# Patient Record
Sex: Male | Born: 1950 | Race: White | Hispanic: No | Marital: Married | Smoking: Current every day smoker
Health system: Southern US, Community
[De-identification: ages and names within clinical notes are randomized; demographics above are authoritative.]

## PROBLEM LIST (undated history)

## (undated) DIAGNOSIS — B1920 Unspecified viral hepatitis C without hepatic coma: Secondary | ICD-10-CM

## (undated) HISTORY — PX: OTHER SURGICAL HISTORY: SHX169

---

## 2007-09-02 ENCOUNTER — Ambulatory Visit: Payer: Self-pay | Admitting: *Deleted

## 2015-10-10 ENCOUNTER — Emergency Department: Payer: Non-veteran care

## 2015-10-10 ENCOUNTER — Encounter: Payer: Self-pay | Admitting: Emergency Medicine

## 2015-10-10 ENCOUNTER — Inpatient Hospital Stay
Admission: EM | Admit: 2015-10-10 | Discharge: 2015-10-13 | DRG: 545 | Disposition: A | Discharge status: Other Acute Inpatient Hospital | Payer: Non-veteran care | Attending: Internal Medicine | Admitting: Internal Medicine

## 2015-10-10 DIAGNOSIS — F17210 Nicotine dependence, cigarettes, uncomplicated: Secondary | ICD-10-CM | POA: Diagnosis present

## 2015-10-10 DIAGNOSIS — M314 Aortic arch syndrome [Takayasu]: Principal | ICD-10-CM | POA: Diagnosis present

## 2015-10-10 DIAGNOSIS — R079 Chest pain, unspecified: Secondary | ICD-10-CM | POA: Diagnosis present

## 2015-10-10 DIAGNOSIS — R1013 Epigastric pain: Secondary | ICD-10-CM | POA: Diagnosis present

## 2015-10-10 DIAGNOSIS — I719 Aortic aneurysm of unspecified site, without rupture: Secondary | ICD-10-CM | POA: Diagnosis present

## 2015-10-10 DIAGNOSIS — R0902 Hypoxemia: Secondary | ICD-10-CM | POA: Diagnosis present

## 2015-10-10 DIAGNOSIS — I700 Atherosclerosis of aorta: Secondary | ICD-10-CM | POA: Diagnosis not present

## 2015-10-10 DIAGNOSIS — R52 Pain, unspecified: Secondary | ICD-10-CM

## 2015-10-10 DIAGNOSIS — I119 Hypertensive heart disease without heart failure: Secondary | ICD-10-CM | POA: Diagnosis present

## 2015-10-10 DIAGNOSIS — I7100 Dissection of unspecified site of aorta: Secondary | ICD-10-CM | POA: Diagnosis present

## 2015-10-10 DIAGNOSIS — R001 Bradycardia, unspecified: Secondary | ICD-10-CM | POA: Diagnosis present

## 2015-10-10 DIAGNOSIS — Z720 Tobacco use: Secondary | ICD-10-CM | POA: Diagnosis present

## 2015-10-10 DIAGNOSIS — I10 Essential (primary) hypertension: Secondary | ICD-10-CM

## 2015-10-10 DIAGNOSIS — I716 Thoracoabdominal aortic aneurysm, without rupture: Secondary | ICD-10-CM | POA: Diagnosis present

## 2015-10-10 DIAGNOSIS — I452 Bifascicular block: Secondary | ICD-10-CM | POA: Diagnosis present

## 2015-10-10 DIAGNOSIS — I7101 Dissection of thoracic aorta: Secondary | ICD-10-CM | POA: Diagnosis present

## 2015-10-10 DIAGNOSIS — I723 Aneurysm of iliac artery: Secondary | ICD-10-CM | POA: Diagnosis present

## 2015-10-10 DIAGNOSIS — Z7982 Long term (current) use of aspirin: Secondary | ICD-10-CM

## 2015-10-10 DIAGNOSIS — J439 Emphysema, unspecified: Secondary | ICD-10-CM | POA: Diagnosis present

## 2015-10-10 DIAGNOSIS — R071 Chest pain on breathing: Secondary | ICD-10-CM | POA: Diagnosis not present

## 2015-10-10 DIAGNOSIS — Z79899 Other long term (current) drug therapy: Secondary | ICD-10-CM | POA: Diagnosis not present

## 2015-10-10 DIAGNOSIS — Z01810 Encounter for preprocedural cardiovascular examination: Secondary | ICD-10-CM | POA: Diagnosis not present

## 2015-10-10 DIAGNOSIS — F17200 Nicotine dependence, unspecified, uncomplicated: Secondary | ICD-10-CM | POA: Diagnosis present

## 2015-10-10 HISTORY — DX: Unspecified viral hepatitis C without hepatic coma: B19.20

## 2015-10-10 LAB — BASIC METABOLIC PANEL
ANION GAP: 9 (ref 5–15)
BUN: 14 mg/dL (ref 6–20)
CALCIUM: 9.5 mg/dL (ref 8.9–10.3)
CO2: 24 mmol/L (ref 22–32)
CREATININE: 0.67 mg/dL (ref 0.61–1.24)
Chloride: 103 mmol/L (ref 101–111)
Glucose, Bld: 115 mg/dL — ABNORMAL HIGH (ref 65–99)
Potassium: 3.8 mmol/L (ref 3.5–5.1)
SODIUM: 136 mmol/L (ref 135–145)

## 2015-10-10 LAB — HEPATIC FUNCTION PANEL
ALBUMIN: 3.9 g/dL (ref 3.5–5.0)
ALT: 80 U/L — AB (ref 17–63)
AST: 79 U/L — AB (ref 15–41)
Alkaline Phosphatase: 72 U/L (ref 38–126)
Bilirubin, Direct: 0.1 mg/dL (ref 0.1–0.5)
Indirect Bilirubin: 0.5 mg/dL (ref 0.3–0.9)
TOTAL PROTEIN: 7.3 g/dL (ref 6.5–8.1)
Total Bilirubin: 0.6 mg/dL (ref 0.3–1.2)

## 2015-10-10 LAB — URINALYSIS COMPLETE WITH MICROSCOPIC (ARMC ONLY)
BILIRUBIN URINE: NEGATIVE
Bacteria, UA: NONE SEEN
GLUCOSE, UA: NEGATIVE mg/dL
Hgb urine dipstick: NEGATIVE
KETONES UR: NEGATIVE mg/dL
Leukocytes, UA: NEGATIVE
NITRITE: NEGATIVE
PROTEIN: 100 mg/dL — AB
SPECIFIC GRAVITY, URINE: 1.033 — AB (ref 1.005–1.030)
Squamous Epithelial / LPF: NONE SEEN
pH: 7 (ref 5.0–8.0)

## 2015-10-10 LAB — CBC
HCT: 44.2 % (ref 40.0–52.0)
HEMOGLOBIN: 15.7 g/dL (ref 13.0–18.0)
MCH: 30.8 pg (ref 26.0–34.0)
MCHC: 35.6 g/dL (ref 32.0–36.0)
MCV: 86.5 fL (ref 80.0–100.0)
PLATELETS: 146 10*3/uL — AB (ref 150–440)
RBC: 5.11 MIL/uL (ref 4.40–5.90)
RDW: 13 % (ref 11.5–14.5)
WBC: 10.4 10*3/uL (ref 3.8–10.6)

## 2015-10-10 LAB — TROPONIN I

## 2015-10-10 LAB — LIPASE, BLOOD: LIPASE: 33 U/L (ref 11–51)

## 2015-10-10 MED ORDER — MORPHINE SULFATE (PF) 4 MG/ML IV SOLN
4.0000 mg | Freq: Once | INTRAVENOUS | Status: AC
  Administered 2015-10-10: 4 mg via INTRAVENOUS

## 2015-10-10 MED ORDER — MORPHINE SULFATE (PF) 4 MG/ML IV SOLN
4.0000 mg | Freq: Once | INTRAVENOUS | Status: AC
  Administered 2015-10-10: 4 mg via INTRAVENOUS
  Filled 2015-10-10: qty 1

## 2015-10-10 MED ORDER — HYDROMORPHONE HCL 1 MG/ML IJ SOLN
1.0000 mg | INTRAMUSCULAR | Status: DC | PRN
  Administered 2015-10-11 (×3): 1 mg via INTRAVENOUS
  Filled 2015-10-10 (×3): qty 1

## 2015-10-10 MED ORDER — MORPHINE SULFATE (PF) 4 MG/ML IV SOLN
INTRAVENOUS | Status: AC
  Administered 2015-10-10: 4 mg via INTRAVENOUS
  Filled 2015-10-10: qty 1

## 2015-10-10 MED ORDER — NITROPRUSSIDE SODIUM 25 MG/ML IV SOLN
0.0000 ug/kg/min | Freq: Once | INTRAVENOUS | Status: AC
  Administered 2015-10-10: 0.3 ug/kg/min via INTRAVENOUS
  Filled 2015-10-10: qty 2

## 2015-10-10 MED ORDER — SODIUM CHLORIDE 0.9 % IV BOLUS (SEPSIS)
500.0000 mL | Freq: Once | INTRAVENOUS | Status: AC
  Administered 2015-10-10: 500 mL via INTRAVENOUS

## 2015-10-10 MED ORDER — ONDANSETRON HCL 4 MG/2ML IJ SOLN
4.0000 mg | Freq: Once | INTRAMUSCULAR | Status: AC
  Administered 2015-10-10: 4 mg via INTRAVENOUS
  Filled 2015-10-10: qty 2

## 2015-10-10 MED ORDER — IOPAMIDOL (ISOVUE-370) INJECTION 76%
100.0000 mL | Freq: Once | INTRAVENOUS | Status: AC | PRN
  Administered 2015-10-10: 100 mL via INTRAVENOUS

## 2015-10-10 NOTE — ED Provider Notes (Signed)
[facility]. I asked if the paperwork can be filled out to confirm this. The person on talking to is a resident and cannot do it but she says she will talk to the AOD.   Cory NatalPaul F Malinda, MD 10/10/15 2158

## 2015-10-10 NOTE — ED Provider Notes (Addendum)
[facility] Emergency Department Provider Note   ____________________________________________   First MD Initiated Contact with Patient 10/10/15 1644     (approximate)  I have reviewed the triage vital signs and the nursing notes.   HISTORY  Chief Complaint Chest Pain and Abdominal Pain    HPI Cory Wilson is a 65 y.o. male with no chronic medical problems, though admittedly he has not seen a primary care doctor nearly 10 years, who presents for evaluation of intermittent epigastric pain radiating into the chest ongoing today, gradual onset, constant, no modifying factors, severe on EMS arrival but improved after he received nitroglycerin and aspirin, currently mild. No shortness of breath, no sudden sweating, no vomiting, diarrhea, fevers or chills. He has never had this pain before.   Past Medical History:  Diagnosis Date  . Hepatitis C     Patient Active Problem List   Diagnosis Date Noted  . Tobacco use   . Penetrating atherosclerotic ulcer of aorta (HCC)   . Dissection of aorta, unspecified site   . Tobacco use disorder   . Essential hypertension   . Pain in the chest   . Intramural aortic hematoma (HCC) 10/10/2015    Past Surgical History:  Procedure Laterality Date  . 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 Pt states he caught something on deployment to [place]    Prior to Admission medications   Medication Sig Start Date End Date Taking? Authorizing Provider  aspirin EC 81 MG tablet Take 81 mg by mouth daily.   Yes Historical Provider, MD  Fish Oil-Cholecalciferol (FISH OIL + D3 PO) Take 1 capsule by mouth daily.   Yes Historical Provider, MD  Multiple Vitamins-Minerals (MULTIVITAMIN WITH MINERALS) tablet Take 1 tablet by mouth daily.   Yes Historical Provider, MD  albuterol (PROVENTIL) (2.5 MG/3ML) 0.083% nebulizer solution Take 3 mLs (2.5 mg total) by nebulization every 4 (four) hours as  needed for wheezing or shortness of breath. 10/13/15   Wyatt Haste, MD  antiseptic oral rinse (CPC / CETYLPYRIDINIUM CHLORIDE 0.05%) 0.05 % LIQD solution 7 mLs by Mouth Rinse route 2 (two) times daily. 10/13/15   Wyatt Haste, MD  famotidine (PEPCID) 20-0.9 MG/50ML-% Inject 50 mLs (20 mg total) into the vein every 12 (twelve) hours. 10/13/15   Wyatt Haste, MD  hydrALAZINE (APRESOLINE) 25 MG tablet Take 1 tablet (25 mg total) by mouth every 8 (eight) hours. 10/13/15   Wyatt Haste, MD  HYDROmorphone (DILAUDID) 1 MG/ML injection Inject 1 mL (1 mg total) into the vein every 2 (two) hours as needed for severe pain. 10/13/15   Wyatt Haste, MD  ipratropium-albuterol (DUONEB) 0.5-2.5 (3) MG/3ML SOLN Take 3 mLs by nebulization every 4 (four) hours. 10/13/15   Wyatt Haste, MD  labetalol (NORMODYNE,TRANDATE) 5 MG/ML injection Inject 4 mLs (20 mg total) into the vein every 4 (four) hours as needed (for SBP >150). 10/13/15   Wyatt Haste, MD  losartan (COZAAR) 50 MG tablet Take 1 tablet (50 mg total) by mouth daily. 10/13/15   Wyatt Haste, MD  mometasone-formoterol [facility]) 100-5 MCG/ACT AERO Inhale 2 puffs into the lungs 2 (two) times daily. 10/13/15   Wyatt Haste, MD  niCARdipine in saline (CARDENE-IV) 20-0.86 MG/200ML-% SOLN Inject 3-15 mg/hr into the vein continuous. 10/13/15   Wyatt Haste, MD  nicotine (NICODERM CQ - DOSED IN MG/24 HOURS) 21 mg/24hr patch Place 1 patch (21  mg total) onto the skin daily. 10/13/15   Wyatt Haste, MD  nitroPRUSSide 50 mg in dextrose 5 % 250 mL Inject 0-0.2634 mg/min into the vein continuous. 10/13/15   Wyatt Haste, MD  ondansetron [facility]) 4 MG/2ML SOLN injection Inject 2 mLs (4 mg total) into the vein every 6 (six) hours as needed for nausea. 10/13/15   Wyatt Haste, MD  pantoprazole (PROTONIX) 40 MG tablet Take 1 tablet (40 mg total) by mouth daily at 12 noon. 10/13/15   Wyatt Haste, MD  senna (SENOKOT) 8.6 MG TABS tablet Take 1 tablet (8.6 mg total) by mouth daily. 10/13/15   Wyatt Haste, MD  sodium chloride 0.9 % infusion Inject 250 mLs into the vein as needed (if IV carrier fluid needed.). 10/13/15   Wyatt Haste, MD  sodium chloride 0.9 % infusion Inject 100 mLs into the vein continuous. 10/13/15   Wyatt Haste, MD  tiotropium (SPIRIVA) 18 MCG inhalation capsule Place 1 capsule (18 mcg total) into inhaler and inhale daily. 10/13/15   Wyatt Haste, MD  zolpidem (AMBIEN) 5 MG tablet Take 1 tablet (5 mg total) by mouth at bedtime as needed for sleep. 10/13/15   Wyatt Haste, MD    Allergies Review of patient's allergies indicates no known allergies.  History reviewed. No pertinent family history.  Social History Social History  Substance Use Topics  . Smoking status: Current Every Day Smoker    Packs/day: 1.00    Types: Cigarettes  . Smokeless tobacco: Never Used  . Alcohol use No    Review of Systems Constitutional: No fever/chills Eyes: No visual changes. ENT: No sore throat. Cardiovascular: +chest pain. Respiratory: Denies shortness of breath. Gastrointestinal: + abdominal pain.  No nausea, no vomiting.  No diarrhea.  No constipation. Genitourinary: Negative for dysuria. Musculoskeletal: Negative for back pain. Skin: Negative for rash. Neurological: Negative for headaches, focal weakness or numbness.  10-point ROS otherwise negative.  ____________________________________________   PHYSICAL EXAM:  Vitals:   10/13/15 1220 10/13/15 1225 10/13/15 1230 10/13/15 1235  BP: (!) 104/57 109/88 129/71 120/69  Pulse: 62 62 (!) 59 (!) 59  Resp: Temp:      TempSrc:      SpO2: 92% 91% 93% 93%  Weight:      Height:          Constitutional: Alert and oriented. Well appearing and in no acute distress. Eyes: Conjunctivae are normal. PERRL. EOMI. Head: Atraumatic. Nose: No congestion/rhinnorhea. Mouth/Throat: Mucous membranes are moist.  Oropharynx non-erythematous. Neck: No stridor. Supple without meningismus. Cardiovascular: Normal rate,  regular rhythm. Grossly normal heart sounds.  Good peripheral circulation. Respiratory: Normal respiratory effort.  No retractions. Lungs CTAB. Gastrointestinal: Soft with mild tenderness to palpation throughout the epigastrium, no rebound or guarding.Marland Kitchen No CVA tenderness. Genitourinary: Deferred Musculoskeletal: No lower extremity tenderness nor edema.  No joint effusions. Neurologic:  Normal speech and language. No gross focal neurologic deficits are appreciated. No gait instability. Skin:  Skin is warm, dry and intact. No rash noted. Psychiatric: Mood and affect are normal. Speech and behavior are normal.  ____________________________________________   LABS (all labs ordered are listed, but only abnormal results are displayed)  Labs Reviewed  BASIC METABOLIC PANEL - Abnormal; Notable for the following:       Result Value   Glucose, Bld 115 (*)    All other components within normal limits  CBC - Abnormal; Notable for the following:  Platelets 146 (*)    All other components within normal limits  HEPATIC FUNCTION PANEL - Abnormal; Notable for the following:    AST 79 (*)    ALT 80 (*)    All other components within normal limits  URINALYSIS COMPLETEWITH MICROSCOPIC ([facility] ONLY) - Abnormal; Notable for the following:    Color, Urine YELLOW (*)    APPearance HAZY (*)    Specific Gravity, Urine 1.033 (*)    Protein, ur 100 (*)    All other components within normal limits  CBC - Abnormal; Notable for the following:    WBC 12.3 (*)    Platelets 139 (*)    All other components within normal limits  BASIC METABOLIC PANEL - Abnormal; Notable for the following:    Glucose, Bld 169 (*)    All other components within normal limits  LIPID PANEL - Abnormal; Notable for the following:    Triglycerides 386 (*)    HDL 28 (*)    VLDL 77 (*)    All other components within normal limits  GLUCOSE, CAPILLARY - Abnormal; Notable for the following:    Glucose-Capillary 125 (*)    All other  components within normal limits  BASIC METABOLIC PANEL - Abnormal; Notable for the following:    Glucose, Bld 142 (*)    Creatinine, Ser 0.59 (*)    Calcium 8.4 (*)    All other components within normal limits  HEPATIC FUNCTION PANEL - Abnormal; Notable for the following:    AST 57 (*)    ALT 66 (*)    Total Bilirubin 1.6 (*)    Bilirubin, Direct 0.6 (*)    Indirect Bilirubin 1.0 (*)    All other components within normal limits  BASIC METABOLIC PANEL - Abnormal; Notable for the following:    Glucose, Bld 131 (*)    Creatinine, Ser 0.57 (*)    Calcium 8.7 (*)    All other components within normal limits  CBC - Abnormal; Notable for the following:    WBC 16.6 (*)    Platelets 139 (*)    All other components within normal limits  BLOOD GAS, ARTERIAL - Abnormal; Notable for the following:    pO2, Arterial 65 (*)    Acid-Base Excess 3.5 (*)    Allens test (pass/fail) POSITIVE (*)    All other components within normal limits  CBC - Abnormal; Notable for the following:    WBC 18.7 (*)    RBC 4.23 (*)    HCT 37.4 (*)    Platelets 139 (*)    All other components within normal limits  BASIC METABOLIC PANEL - Abnormal; Notable for the following:    Sodium 132 (*)    Glucose, Bld 138 (*)    BUN 27 (*)    Calcium 7.6 (*)    All other components within normal limits  MRSA PCR SCREENING  TROPONIN I  LIPASE, BLOOD  PHOSPHORUS  MAGNESIUM  MAGNESIUM  PHOSPHORUS  TROPONIN I  LACTIC ACID, PLASMA  LIPASE, BLOOD  MAGNESIUM  TROPONIN I  LACTIC ACID, PLASMA   ____________________________________________  EKG  ED ECG REPORT I, Gayla Doss, the attending physician, personally viewed and interpreted this ECG.   Date: 10/10/2015  EKG Time: 16:49  Rate: 53  Rhythm: normal sinus rhythm  Axis: normal  Intervals:right bundle branch block and LAFB  ST&T Change: No acute ST elevation or acute ST depression. T-wave abnormalities in the inferior leads, Q waves in aVL, aVR,  V1,  V2.  ____________________________________________  RADIOLOGY  CXR IMPRESSION: No radiographic evidence of acute cardiopulmonary disease.  RUQ ultrasound IMPRESSION: Small gallstones, gallbladder polyps in gallbladder sludge. No sonographic Eulah Pont sign is noted.  Changes suggestive of aortic aneurysm with possible dissection. A CT is already scheduled for further evaluation of this abnormality.  CTA chest, abdomen and pelvis IMPRESSION: 1. Positive for acute aortic syndrome in the chest with aortic hematoma originating just distal to the great vessels. 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 No periaortic soft tissue stranding. 2. Infrarenal abdominal aorta measuring 5.0 x 4.7 cm. Eccentric mural thrombus with thin band of thrombus versus short-segment dissection in the infrarenal portion. No evidence of aortic rupture. Recommend followup by abdomen and pelvis CTA in 3-6 months, and vascular surgery referral/consultation if not already obtained. This recommendation follows ACR consensus guidelines: White Paper of the ACR Incidental Findings Committee II on Vascular Findings. J Am Coll Radiol 2013; 10:789-794. 3. 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 4. Incidental chest findings of emphysema and coronary artery calcifications. 5. Incidental abdomen/pelvis findings of sigmoid colonic diverticulosis without evidence of diverticulitis. Critical Value/emergent preliminary results were discussed by telephone at the time of interpretation on 10/10/2015 at 7:20 pm with Dr. Toney Rakes , who verbally acknowledged these results. ____________________________________________   PROCEDURES  Procedure(s) performed: None  Procedures  Critical Care performed:   CRITICAL CARE Performed by: Toney Rakes A   Total critical care time: 35 minutes  Critical care time was exclusive  of separately billable procedures and treating other patients.  Critical care was necessary to treat or prevent imminent or life-threatening deterioration.  Critical care was time spent personally by me on the following activities: development of treatment plan with patient and/or surrogate as well as nursing, discussions with consultants, evaluation of patient's response to treatment, examination of patient, obtaining history from patient or surrogate, ordering and performing treatments and interventions, ordering and review of laboratory studies, ordering and review of radiographic studies, pulse oximetry and re-evaluation of patient's condition.  ____________________________________________   INITIAL IMPRESSION / ASSESSMENT AND PLAN / ED COURSE  Pertinent labs & imaging results that were available during my care of the patient were reviewed by me and considered in my medical decision making (see chart for details).  Cory Wilson is a 65 y.o. male with no chronic medical problems, though admittedly he has not seen a primary care doctor in over 8 years, who presents for evaluation of intermittent epigastric pain radiating into the chest ongoing today. On exam, he is generally well-appearing and in no acute distress. Vital signs stable, he is afebrile. He has a benign physical examination. EKG not consistent with acute ischemia however he does have concerning T-wave inversions in the inferior leads that almost look like Wellen's waves. He also has Q waves in several leads. This clinical picture is concerning for possibly atypical ACS though gallbladder pathology, GERD, gastritis is also on the differential. We'll treat him symptomatically, obtain screen cardiac labs, right upper quadrant ultrasound, chest x-ray and reassess for disposition.  ----------------------------------------- 8:00 PM on 10/10/2015 ----------------------------------------- Patient with improvement of his pain at this  time. Ultrasound showed gallstones, no acute cholecystitis with there was question of aortic aneurysm with dissection for which CTA of the chest and pelvis was obtained and shows penetrating thoracic aortic ulcer with aortic hematoma. I discussed the case with Dr. Wyn Quaker of vascular surgery who advises  admission to the intensivist service and repair of the ulcer can occur nonemergently over the next few days. Patient wants  To [facility] where he has insurance coverage. I have faxed the paperwork to the [facility] in hopes of them accepting him as a transfer but we are awaiting the response. I have started and will actively titrate nitroprusside drip for management of his blood pressure, he is already bradycardic with a heart rate of 50 bpm so we will not give beta blocker. Care transferred to Dr. Darnelle CatalanMalinda at this time.   Clinical Course     ____________________________________________   FINAL CLINICAL IMPRESSION(S) / ED DIAGNOSES  Final diagnoses:  Pain  Epigastric pain  Chest pain, unspecified chest pain type  Penetrating atherosclerotic ulcer of aorta (HCC)  Intramural aortic hematoma (HCC)  Essential hypertension      NEW MEDICATIONS STARTED DURING THIS VISIT:  Discharge Medication List as of 10/13/2015  1:40 PM    START taking these medications   Details  albuterol (PROVENTIL) (2.5 MG/3ML) 0.083% nebulizer solution Take 3 mLs (2.5 mg total) by nebulization every 4 (four) hours as needed for wheezing or shortness of breath., Starting Wed 10/13/2015, No Print    antiseptic oral rinse (CPC / CETYLPYRIDINIUM CHLORIDE 0.05%) 0.05 % LIQD solution 7 mLs by Mouth Rinse route 2 (two) times daily., Starting Wed 10/13/2015, No Print    famotidine (PEPCID) 20-0.9 MG/50ML-% Inject 50 mLs (20 mg total) into the vein every 12 (twelve) hours., Starting Wed 10/13/2015, No Print    hydrALAZINE (APRESOLINE) 25 MG tablet Take 1 tablet (25 mg total) by mouth every 8 (eight) hours., Starting Wed 10/13/2015, No Print     HYDROmorphone (DILAUDID) 1 MG/ML injection Inject 1 mL (1 mg total) into the vein every 2 (two) hours as needed for severe pain., Starting Wed 10/13/2015, No Print    ipratropium-albuterol (DUONEB) 0.5-2.5 (3) MG/3ML SOLN Take 3 mLs by nebulization every 4 (four) hours., Starting Wed 10/13/2015, No Print    labetalol (NORMODYNE,TRANDATE) 5 MG/ML injection Inject 4 mLs (20 mg total) into the vein every 4 (four) hours as needed (for SBP >150)., Starting Wed 10/13/2015, No Print    losartan (COZAAR) 50 MG tablet Take 1 tablet (50 mg total) by mouth daily., Starting Wed 10/13/2015, No Print    mometasone-formoterol (DULERA) 100-5 MCG/ACT AERO Inhale 2 puffs into the lungs 2 (two) times daily., Starting Wed 10/13/2015, No Print    niCARdipine in saline (CARDENE-IV) 20-0.86 MG/200ML-% SOLN Inject 3-15 mg/hr into the vein continuous., Starting Wed 10/13/2015, No Print    nicotine (NICODERM CQ - DOSED IN MG/24 HOURS) 21 mg/24hr patch Place 1 patch (21 mg total) onto the skin daily., Starting Wed 10/13/2015, No Print    nitroPRUSSide 50 mg in dextrose 5 % 250 mL Inject 0-0.2634 mg/min into the vein continuous., Starting Wed 10/13/2015, No Print    ondansetron (ZOFRAN) 4 MG/2ML SOLN injection Inject 2 mLs (4 mg total) into the vein every 6 (six) hours as needed for nausea., Starting Wed 10/13/2015, No Print    pantoprazole (PROTONIX) 40 MG tablet Take 1 tablet (40 mg total) by mouth daily at 12 noon., Starting Wed 10/13/2015, No Print    pneumococcal 23 valent vaccine (PNU-IMMUNE) 25 MCG/0.5ML injection Inject 0.5 mLs into the muscle tomorrow at 10 am., Starting Wed 10/13/2015, No Print    senna (SENOKOT) 8.6 MG TABS tablet Take 1 tablet (8.6 mg total) by mouth daily., Starting Wed 10/13/2015, No Print    !!  sodium chloride 0.9 % infusion Inject 250 mLs into the vein as needed (if IV carrier fluid needed.)., Starting Wed 10/13/2015, No Print    !! sodium chloride 0.9 % infusion Inject 100 mLs into the vein continuous.,  Starting Wed 10/13/2015, No Print    tiotropium (SPIRIVA) 18 MCG inhalation capsule Place 1 capsule (18 mcg total) into inhaler and inhale daily., Starting Wed 10/13/2015, No Print    zolpidem (AMBIEN) 5 MG tablet Take 1 tablet (5 mg total) by mouth at bedtime as needed for sleep., Starting Wed 10/13/2015, No Print     !! - Potential duplicate medications found. Please discuss with provider.       Note:  This document was prepared using Dragon voice recognition software and may include unintentional dictation errors.    Gayla Doss, MD 10/10/15 2010    Gayla Doss, MD 10/14/15 [phone]

## 2015-10-10 NOTE — H&P (Addendum)
[employer] [employer] - [place] @ [facility] Admission History and Physical Tonye Royalty, D.O.  ---------------------------------------------------------------------------------------------------------------------   PATIENT NAME: Cory Wilson MR#: [id number] DATE OF BIRTH: 24-Jan-1951 DATE OF ADMISSION: 10/10/2015 PRIMARY CARE PHYSICIAN: No primary care provider on file.  REQUESTING/REFERRING PHYSICIAN: ED Dr. Darnelle Catalan  CHIEF COMPLAINT: Chief Complaint  Patient presents with  . Chest Pain  . Abdominal Pain    HISTORY OF PRESENT ILLNESS: Cory Wilson is a 65 y.o. male with history of hepatitis C was in a usual state of health until today when he began experiencing 10/10 "pulsating" chest pain in the lower mid-sternum association with nausea, dry heaves, shortness of breath and diaphoresis.  Pain was relieved after EMS placed nitropaste.  He states that he has had "heartburn" in the same region intermittently over the past few months which seemed to have been relieved by Rolaids.  He has not had a physical exam in the past 7 years, and has never had a cardiac workup.    Otherwise there has been no change in status. There has been no recent change in medication or diet.  There has been no recent illness, travel or sick contacts.    Patient denies fevers/chills, weakness, dizziness, C/D, dysuria/frequency, changes in mental status.   EMS/ED COURSE:  Patient rec'd nitropaste in the ambulance was started on a nitroprusside drip in the ED.    PAST MEDICAL HISTORY: Past Medical History:  Diagnosis Date  . Hepatitis C       PAST SURGICAL HISTORY: Past Surgical History:  Procedure Laterality Date  . 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 Pt states he caught something on deployment to [place]      SOCIAL HISTORY: Social History  Substance Use Topics  . Smoking status: Current Every Day Smoker    Packs/day: 1.00    Types: Cigarettes  .  Smokeless tobacco: Never Used  . Alcohol use No      FAMILY HISTORY: History reviewed. No pertinent family history.   MEDICATIONS AT HOME: Prior to Admission medications   Medication Sig Start Date End Date Taking? Authorizing Provider  aspirin EC 81 MG tablet Take 81 mg by mouth daily.   Yes Historical Provider, MD  Fish Oil-Cholecalciferol (FISH OIL + D3 PO) Take 1 capsule by mouth daily.   Yes Historical Provider, MD  Multiple Vitamins-Minerals (MULTIVITAMIN WITH MINERALS) tablet Take 1 tablet by mouth daily.   Yes Historical Provider, MD      DRUG ALLERGIES: No Known Allergies   REVIEW OF SYSTEMS: CONSTITUTIONAL: No fever/chills. No fatigue, weakness. No weight gain, no weight loss. EYES: No blurry or double vision. ENT: No tinnitus. No postnasal drip. No redness or soreness of the oropharynx. RESPIRATORY: No cough, no wheeze, no hemoptysis. No dyspnea. CARDIOVASCULAR:  (+)chest pain. No orthopnea. No palpitations. No syncope. GASTROINTESTINAL: (+) nausea, no vomiting or diarrhea. (+) abdominal pain. No melena or hematochezia. GENITOURINARY: No dysuria or hematuria. ENDOCRINE: No polyuria or nocturia. No heat or cold intolerance. HEMATOLOGY: No anemia. No bruising. No bleeding. INTEGUMENTARY: No rashes. No lesions. MUSCULOSKELETAL: No arthritis. No swelling. No gout. NEUROLOGIC: No numbness, tingling, weakness or ataxia. No seizure-type activity. PSYCHIATRIC: No anxiety. No depression. No insomnia.  PHYSICAL EXAMINATION: VITAL SIGNS: Blood pressure (!) 148/85, pulse 66, temperature 98.1 F (36.7 C), temperature source Oral, resp. rate (!) 24, height 6\' 1"  (1.854 m), weight 97.5 kg (215 lb), SpO2 95 %.  GENERAL: 65 y.o.-year-old white male patient, well-developed, well-nourished lying  in the bed in no acute distress. EYES: Pupils equal, round, reactive to light and accommodation. No scleral icterus. Extraocular muscles intact. HEENT: Head atraumatic, normocephalic.  Oropharynx and nasopharynx clear. Mucus membranes moist. NECK: Supple, full range of motion. No JVD, no bruit heard. No thyroid enlargement, no tenderness, no lymphadenopathy. CHEST: Normal breath sounds bilaterally, no wheezing, rales, rhonchi or crepitation. No use of accessory muscles of respiration.  No reproducible chest wall tenderness.  CARDIOVASCULAR: S1, S2 normal. No murmurs, rubs, or gallops. Cap refill <2 seconds. ABDOMEN: Soft, nontender, nondistended. No palpable mass or bruit heard. No rebound, guarding, rigidity. Normoactive bowel sounds present in all four quadrants. No organomegaly or mass. EXTREMITIES: Full range of motion. No pedal edema, cyanosis, or clubbing. NEUROLOGIC: Cranial nerves II through XII are grossly intact with no focal sensorimotor deficit. Muscle strength 5/5 in all extremities. Sensation intact. Gait not checked. PSYCHIATRIC: The patient is alert and oriented x 3. Normal affect, mood, thought content. SKIN: Warm, dry, and intact without obvious rash, lesion, or ulcer.  LABORATORY PANEL:  CBC  Recent Labs Lab 10/10/15 1701  WBC 10.4  HGB 15.7  HCT 44.2  PLT 146*   ----------------------------------------------------------------------------------------------------------------- Chemistries  Recent Labs Lab 10/10/15 1701  NA 136  K 3.8  CL 103  CO2 24  GLUCOSE 115*  BUN 14  CREATININE 0.67  CALCIUM 9.5  AST 79*  ALT 80*  ALKPHOS 72  BILITOT 0.6   ------------------------------------------------------------------------------------------------------------------ Cardiac Enzymes  Recent Labs Lab 10/10/15 1701  TROPONINI <0.03   ------------------------------------------------------------------------------------------------------------------  RADIOLOGY: Dg Chest Portable 1 View  Result Date: 10/10/2015 CLINICAL DATA:  65 year old male with history of chest pain since 1 p.m. today, with radiation to the right side. Emesis. No associated  shortness of breath. Hypertension. EXAM: PORTABLE CHEST 1 VIEW COMPARISON:  Chest x-ray 09/02/2007. FINDINGS: Multiple tiny calcified granulomas are noted, most evident in the left lower lobe. Calcified left hilar lymph nodes. Lung volumes are normal. No consolidative airspace disease. No pleural effusions. No pneumothorax. No pulmonary nodule or mass noted. Pulmonary vasculature and the cardiomediastinal silhouette are within normal limits. IMPRESSION: No radiographic evidence of acute cardiopulmonary disease. Electronically Signed   By: Trudie Reedaniel  Entrikin M.D.   On: 10/10/2015 17:43   Ct Angio Chest/abd/pel For Dissection W And/or Wo Contrast  Result Date: 10/10/2015 CLINICAL DATA:  Acute onset of epigastric pain today. Clinical concern for aortic dissection. EXAM: CT ANGIOGRAPHY CHEST, ABDOMEN AND PELVIS TECHNIQUE: Multidetector CT imaging through the chest, abdomen and pelvis was performed using the standard protocol during bolus administration of intravenous contrast. Multiplanar reconstructed images and MIPs were obtained and reviewed to evaluate the vascular anatomy. CONTRAST:  100 cc Isovue 370 IV COMPARISON:  No prior comparison exams. FINDINGS: CTA CHEST FINDINGS Vascular: Examination is positive for acute aortic syndrome with intramural hematoma arising just distal a takeoff of the great vessels extending to the level of the diaphragmatic hiatus. The intramural hematoma is hyperdense on noncontrast imaging. Small focus of extraluminal contrast is seen within the is intramural hematoma in the distal thoracic aorta image 75 series 2 vein may be a penetrating ulcer. There is subsequent aneurysmal dilatation of the descending thoracic aorta measuring up to 4 cm. The aorta at the level of the diaphragmatic hiatus measures 3.6 x 3.9 cm. No periaortic soft tissue stranding. There is a conventional branching pattern from the aortic arch. There is atherosclerotic ossified noncalcified plaque at the origins of the  great vessels, without hematoma involvement. There is diffuse calcified and  noncalcified atheromatous plaque. No central pulmonary embolus. There are coronary artery calcifications. Mediastinum: No enlarged mediastinal or hilar lymph nodes. No pericardial fluid. Lungs/pleura: Biapical pleural-parenchymal scarring. Mild emphysema. No confluent airspace disease. No evidence pulmonary edema. Trachea and mainstem bronchi are patent. There is no pleural effusion or pleural fluid. Musculoskeletal: There are no acute or suspicious osseous abnormalities. Multilevel degenerative change throughout the spine with multiple Schmorl's nodes. Review of the MIP images confirms the above findings. CTA ABDOMEN AND PELVIS FINDINGS Vascular: Fusiform infrarenal abdominal aortic aneurysm, proximal aspect less than 1 cm from the takeoff of the left renal artery. Maximal dimension 5.0 x 4.7 cm. Distal extent just proximal to the iliac bifurcation. There is peripherally eccentric thrombus about the right lateral aspect with a thin band of thrombus extending into the opacified lumen versus limited short-segment dissection. There is no periaortic soft tissue stranding. 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 Normal caliber left common iliac artery. The celiac, superior mesenteric, and inferior mesenteric arteries are patent. There is an accessory artery arising from the most abdominal aorta supplying the lesser curvature of the stomach. Single bilateral renal arteries are patent. There is diffuse calcified and noncalcified atheromatous plaque. Hepatobiliary: The liver is prominent size measuring 21 cm cranial caudal. No focal abnormality detected on arterial phase imaging. Gallbladder physiologically distended. No calcified stone. Pancreas:  No ductal dilatation or inflammation. Spleen:  Normal in size.  Normal arterial phase imaging appearance. Adrenals:  No nodule. Kidneys: Symmetric enhancement. No  hydronephrosis. Cyst in the upper left kidney. Stomach/bowel: Distal esophageal wall thickening with small hiatal hernia. Stomach physiologically distended. Suboptimal bowel assessment given lack of enteric contrast. No bowel dilatation. Moderate stool burden throughout the colon. Sigmoid colonic diverticulosis without diverticulitis. Appendix is normal. Lymphatics: No evidence of retroperitoneal, mesenteric or pelvic adenopathy. Bladder: Distended. Reproductive: Central prostatic calcifications. No acute abnormality. Other:  No free air free fluid.  No intra-abdominal abscess. Musculoskeletal: There are no acute or suspicious osseous abnormalities. Degenerative change throughout the lumbar spine, with endplate changes at L5-S1. Review of the MIP images confirms the above findings. IMPRESSION: 1. Positive for acute aortic syndrome in the chest with aortic hematoma originating just distal to the great vessels. 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 No periaortic soft tissue stranding. 2. Infrarenal abdominal aorta measuring 5.0 x 4.7 cm. Eccentric mural thrombus with thin band of thrombus versus short-segment dissection in the infrarenal portion. No evidence of aortic rupture. Recommend followup by abdomen and pelvis CTA in 3-6 months, and vascular surgery referral/consultation if not already obtained. This recommendation follows ACR consensus guidelines: White Paper of the ACR Incidental Findings Committee II on Vascular Findings. J Am Coll Radiol 2013; 10:789-794. 3. 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 4. Incidental chest findings of emphysema and coronary artery calcifications. 5. Incidental abdomen/pelvis findings of sigmoid colonic diverticulosis without evidence of diverticulitis. Critical Value/emergent preliminary results were discussed by telephone at the time of interpretation on 10/10/2015 at 7:20 pm  with Dr. Toney Rakes , who verbally acknowledged these results. Electronically Signed   By: Rubye Oaks M.D.   On: 10/10/2015 19:46   US Abdomen Limited Ruq  Result Date: 10/10/2015 CLINICAL DATA:  Right upper quadrant pain for several hours, initial encounter EXAM: US ABDOMEN LIMITED - RIGHT UPPER QUADRANT COMPARISON:  None. FINDINGS: Gallbladder: Gallbladder is well distended with evidence of gallbladder sludge. Some nondependent echogenicities are  noted along the gallbladder wall consistent with gallbladder polyps. Tiny gallstones are noted as well. No significant pericholecystic fluid is noted. No wall thickening is seen. Common bile duct: Diameter: 3.3 mm. Liver: No focal lesion identified. Within normal limits in parenchymal echogenicity. Incidental note is made of dilatation of the mid infrarenal aorta. The maximum dimension is 5.3 cm. Echogenic line is noted within the aorta with color flow on both sides suggestive of dissection. IMPRESSION: Small gallstones, gallbladder polyps in gallbladder sludge. No sonographic Eulah Pont sign is noted. Changes suggestive of aortic aneurysm with possible dissection. A CT is already scheduled for further evaluation of this abnormality. Electronically Signed   By: Alcide Clever M.D.   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 Q waves in aVL, AVR, V1 and V2.   IMPRESSION AND PLAN:  This is a 65 y.o. male  with a history of hepatitis C now being admitted with: 1. 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 Wyn Quaker, vascular surgery who agreed with ICU admission, nitroprusside drip for BP control, with a goal systolic 100-120, (not using Beta blocker due to low heart rate), preop cardiac evaluation and probably OR for repair on Wednesday pending clearance.  Will use Morphine for pain control, Zofran, Pepcid for GI prophylaxis.  DVT Px will be with SCDs only given hematoma  and high risk of bleeding.    Fluids: IVNS Diet/Nutrition: Clear liquids heart healthy for now DVT Px: SCDs only  All the records are reviewed and case discussed with ED provider. Management plans discussed with the patient and/or family who express understanding and agree with plan of care.  CODE STATUS: Full TOTAL TIME TAKING CARE OF THIS PATIENT: 60 minutes.   Jery Hollern D.O. on 10/10/2015 at 11:37 PM Between 7am to 6pm - Pager - [phone] After 6pm go to [email or web address] - [facility] Office [phone] CC: Primary care physician; No primary care provider on file.     Note: This dictation was prepared with Dragon dictation along with smaller phrase technology. Any transcriptional errors that result from this process are unintentional.

## 2015-10-10 NOTE — ED Notes (Signed)
Nitro paste placed PTA by EMS removed from pt's L chest per EDP instruction.

## 2015-10-10 NOTE — ED Triage Notes (Signed)
Pt presents to ED via EMS from home c/o pain starting this morning and worsening suddenly around 1300 to xyphoid process area radiating to R side of rib cage. x1 emesis, no weakness, dizziness, shortness of breath. EMS reports initial BP 240/130, pt given x2 SL Nitro and 2" Nitro paste applied to L chest. Pain upon arrival rated at 1-2 after Nitro.

## 2015-10-11 ENCOUNTER — Inpatient Hospital Stay (HOSPITAL_COMMUNITY)
Admit: 2015-10-11 | Discharge: 2015-10-11 | Disposition: A | Discharge status: Home / Self Care | Payer: Non-veteran care | Attending: Family Medicine | Admitting: Family Medicine

## 2015-10-11 DIAGNOSIS — R079 Chest pain, unspecified: Secondary | ICD-10-CM | POA: Diagnosis present

## 2015-10-11 DIAGNOSIS — I7100 Dissection of unspecified site of aorta: Secondary | ICD-10-CM

## 2015-10-11 DIAGNOSIS — Z01810 Encounter for preprocedural cardiovascular examination: Secondary | ICD-10-CM

## 2015-10-11 DIAGNOSIS — R1013 Epigastric pain: Secondary | ICD-10-CM | POA: Diagnosis present

## 2015-10-11 LAB — LIPID PANEL
Cholesterol: 171 mg/dL (ref 0–200)
HDL: 28 mg/dL — AB (ref >40)
LDL CALC: 66 mg/dL (ref 0–99)
TRIGLYCERIDES: 386 mg/dL — AB (ref <150)
Total CHOL/HDL Ratio: 6.1 RATIO
VLDL: 77 mg/dL — AB (ref 0–40)

## 2015-10-11 LAB — BASIC METABOLIC PANEL
Anion gap: 6 (ref 5–15)
BUN: 16 mg/dL (ref 6–20)
CHLORIDE: 102 mmol/L (ref 101–111)
CO2: 29 mmol/L (ref 22–32)
Calcium: 9 mg/dL (ref 8.9–10.3)
Creatinine, Ser: 0.74 mg/dL (ref 0.61–1.24)
GFR calc Af Amer: >60 mL/min (ref >60)
GFR calc non Af Amer: >60 mL/min (ref >60)
GLUCOSE: 169 mg/dL — AB (ref 65–99)
POTASSIUM: 4.4 mmol/L (ref 3.5–5.1)
Sodium: 137 mmol/L (ref 135–145)

## 2015-10-11 LAB — ECHOCARDIOGRAM COMPLETE
Height: 73 in
Weight: 3097.02 oz

## 2015-10-11 LAB — MAGNESIUM
Magnesium: 2 mg/dL (ref 1.7–2.4)
Magnesium: 2 mg/dL (ref 1.7–2.4)

## 2015-10-11 LAB — CBC
HCT: 42.4 % (ref 40.0–52.0)
HEMOGLOBIN: 14.9 g/dL (ref 13.0–18.0)
MCH: 30.6 pg (ref 26.0–34.0)
MCHC: 35 g/dL (ref 32.0–36.0)
MCV: 87.5 fL (ref 80.0–100.0)
Platelets: 139 10*3/uL — ABNORMAL LOW (ref 150–440)
RBC: 4.85 MIL/uL (ref 4.40–5.90)
RDW: 13.2 % (ref 11.5–14.5)
WBC: 12.3 10*3/uL — ABNORMAL HIGH (ref 3.8–10.6)

## 2015-10-11 LAB — MRSA PCR SCREENING: MRSA BY PCR: NEGATIVE

## 2015-10-11 LAB — PHOSPHORUS
Phosphorus: 2.9 mg/dL (ref 2.5–4.6)
Phosphorus: 2.9 mg/dL (ref 2.5–4.6)

## 2015-10-11 LAB — GLUCOSE, CAPILLARY: GLUCOSE-CAPILLARY: 125 mg/dL — AB (ref 65–99)

## 2015-10-11 LAB — TROPONIN I: Troponin I: <0.03 ng/mL (ref <0.03)

## 2015-10-11 MED ORDER — NITROPRUSSIDE SODIUM 25 MG/ML IV SOLN
0.0000 ug/kg/min | INTRAVENOUS | Status: DC
  Administered 2015-10-11: 2.2 ug/kg/min via INTRAVENOUS
  Administered 2015-10-11 (×3): 3 ug/kg/min via INTRAVENOUS
  Administered 2015-10-12: 1.4 ug/kg/min via INTRAVENOUS
  Administered 2015-10-12: 0.3 ug/kg/min via INTRAVENOUS
  Administered 2015-10-12: 1.4 ug/kg/min via INTRAVENOUS
  Administered 2015-10-13 (×2): 1.8 ug/kg/min via INTRAVENOUS
  Filled 2015-10-11 (×10): qty 2

## 2015-10-11 MED ORDER — LABETALOL HCL 5 MG/ML IV SOLN
20.0000 mg | INTRAVENOUS | Status: DC | PRN
  Administered 2015-10-11 (×2): 20 mg via INTRAVENOUS
  Filled 2015-10-11 (×2): qty 4

## 2015-10-11 MED ORDER — LABETALOL HCL 5 MG/ML IV SOLN
10.0000 mg | Freq: Once | INTRAVENOUS | Status: AC
  Administered 2015-10-11: 10 mg via INTRAVENOUS
  Filled 2015-10-11: qty 4

## 2015-10-11 MED ORDER — PNEUMOCOCCAL VAC POLYVALENT 25 MCG/0.5ML IJ INJ: 0.5000 mL | INJECTION | INTRAMUSCULAR | Status: DC

## 2015-10-11 MED ORDER — FAMOTIDINE IN NACL 20-0.9 MG/50ML-% IV SOLN
20.0000 mg | Freq: Two times a day (BID) | INTRAVENOUS | Status: DC
  Administered 2015-10-11 – 2015-10-13 (×6): 20 mg via INTRAVENOUS
  Filled 2015-10-11 (×7): qty 50

## 2015-10-11 MED ORDER — HYDRALAZINE HCL 20 MG/ML IJ SOLN
10.0000 mg | INTRAMUSCULAR | Status: DC | PRN
  Administered 2015-10-11 – 2015-10-12 (×2): 10 mg via INTRAVENOUS
  Filled 2015-10-11 (×3): qty 1

## 2015-10-11 MED ORDER — GI COCKTAIL ~~LOC~~
30.0000 mL | Freq: Once | ORAL | Status: AC
  Administered 2015-10-12: 30 mL via ORAL
  Filled 2015-10-11: qty 30

## 2015-10-11 MED ORDER — HYDROMORPHONE HCL 1 MG/ML IJ SOLN
1.0000 mg | INTRAMUSCULAR | Status: DC | PRN
  Administered 2015-10-11 – 2015-10-13 (×14): 1 mg via INTRAVENOUS
  Filled 2015-10-11 (×12): qty 1
  Filled 2015-10-11: qty 2
  Filled 2015-10-11 (×2): qty 1

## 2015-10-11 MED ORDER — SODIUM CHLORIDE 0.9 % IV SOLN
INTRAVENOUS | Status: DC
  Administered 2015-10-11 – 2015-10-13 (×4): via INTRAVENOUS

## 2015-10-11 MED ORDER — HYDRALAZINE HCL 20 MG/ML IJ SOLN: 10.0000 mg | INTRAMUSCULAR | Status: DC | PRN

## 2015-10-11 MED ORDER — SENNA 8.6 MG PO TABS
1.0000 | ORAL_TABLET | Freq: Every day | ORAL | Status: DC
  Administered 2015-10-11 – 2015-10-13 (×3): 8.6 mg via ORAL
  Filled 2015-10-11 (×3): qty 1

## 2015-10-11 MED ORDER — ALBUTEROL SULFATE (2.5 MG/3ML) 0.083% IN NEBU
INHALATION_SOLUTION | RESPIRATORY_TRACT | Status: AC
  Filled 2015-10-11: qty 3

## 2015-10-11 MED ORDER — SODIUM CHLORIDE 0.9 % IV SOLN
250.0000 mL | INTRAVENOUS | Status: DC | PRN
  Administered 2015-10-11: 250 mL via INTRAVENOUS

## 2015-10-11 MED ORDER — PANTOPRAZOLE SODIUM 40 MG PO TBEC
40.0000 mg | DELAYED_RELEASE_TABLET | Freq: Every day | ORAL | Status: DC
  Administered 2015-10-12: 40 mg via ORAL
  Filled 2015-10-11 (×3): qty 1

## 2015-10-11 MED ORDER — LOSARTAN POTASSIUM 50 MG PO TABS
50.0000 mg | ORAL_TABLET | Freq: Every day | ORAL | Status: DC
  Administered 2015-10-11 – 2015-10-13 (×3): 50 mg via ORAL
  Filled 2015-10-11 (×3): qty 1

## 2015-10-11 MED ORDER — DEXTROSE 5 % IV SOLN
0.0000 ug/kg/min | INTRAVENOUS | Status: DC
  Filled 2015-10-11: qty 2

## 2015-10-11 MED ORDER — ONDANSETRON HCL 4 MG/2ML IJ SOLN
4.0000 mg | Freq: Four times a day (QID) | INTRAMUSCULAR | Status: DC | PRN
  Administered 2015-10-11 – 2015-10-12 (×5): 4 mg via INTRAVENOUS
  Filled 2015-10-11 (×6): qty 2

## 2015-10-11 MED ORDER — ALBUTEROL SULFATE (2.5 MG/3ML) 0.083% IN NEBU
2.5000 mg | INHALATION_SOLUTION | RESPIRATORY_TRACT | Status: DC | PRN
  Administered 2015-10-11: 2.5 mg via RESPIRATORY_TRACT

## 2015-10-11 MED ORDER — HYDRALAZINE HCL 25 MG PO TABS
25.0000 mg | ORAL_TABLET | Freq: Three times a day (TID) | ORAL | Status: DC
  Administered 2015-10-11 – 2015-10-12 (×4): 25 mg via ORAL
  Filled 2015-10-11 (×4): qty 1

## 2015-10-11 MED ORDER — CETYLPYRIDINIUM CHLORIDE 0.05 % MT LIQD
7.0000 mL | Freq: Two times a day (BID) | OROMUCOSAL | Status: DC
  Administered 2015-10-11 – 2015-10-13 (×4): 7 mL via OROMUCOSAL

## 2015-10-11 MED ORDER — NICARDIPINE HCL IN NACL 20-0.86 MG/200ML-% IV SOLN
3.0000 mg/h | INTRAVENOUS | Status: DC
  Administered 2015-10-11: 3 mg/h via INTRAVENOUS
  Administered 2015-10-12: 5.1 mg/h via INTRAVENOUS
  Administered 2015-10-12: 7.6 mg/h via INTRAVENOUS
  Administered 2015-10-12: 7.5 mg/h via INTRAVENOUS
  Administered 2015-10-12: 5.1 mg/h via INTRAVENOUS
  Administered 2015-10-12: 5 mg/h via INTRAVENOUS
  Administered 2015-10-12: 3 mg/h via INTRAVENOUS
  Administered 2015-10-13: 5 mg/h via INTRAVENOUS
  Administered 2015-10-13: 7.5 mg/h via INTRAVENOUS
  Administered 2015-10-13: 5 mg/h via INTRAVENOUS
  Filled 2015-10-11 (×11): qty 200

## 2015-10-11 MED ORDER — FUROSEMIDE 10 MG/ML IJ SOLN
40.0000 mg | Freq: Once | INTRAMUSCULAR | Status: AC
  Administered 2015-10-11: 40 mg via INTRAVENOUS
  Filled 2015-10-11: qty 4

## 2015-10-11 NOTE — Progress Notes (Signed)
*  PRELIMINARY RESULTS* Echocardiogram 2D Echocardiogram has been performed.  Cristela BlueHege, Shaylin Blatt 10/11/2015, 9:40 AM

## 2015-10-11 NOTE — Progress Notes (Signed)
Pt states he now feels like he is "breathing better". Pt wheezing is better and WOB has improved. BP has decreased to 175/87. Nicardipine is now running.  Pt states now having epigastric pain rating at 5/10. Pt states "I think that inhaler thing set it off.". Pt denied intervention at this time. Wanting to wait to "See if it will get better on its own." Will continue to assess.

## 2015-10-11 NOTE — Consult Note (Signed)
CARDIOLOGY CONSULT NOTE  Patient ID: ZEPPELIN COMMISSO MRN: [id number] DOB/AGE: 64-28-1952 64 y.o.  Admit date: 10/10/2015 Referring Physician:  Dr. Emmit Pomfret Primary Physician: No primary care provider on file. Primary Cardiologist :New Reason for Consultation : Pre op  Date:  10/11/2015    Chief Complaint  Patient presents with  . Chest Pain  . Abdominal Pain      History of Present Illness: Cory Wilson is a 65 y.o. 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 The patient is not aware of any previous cardiac history and has not seen a physician in more than 7 years. He is not aware of medical problems and only reports having back surgery in the past.hepatitis C is listed in medical problem. He does not take any medications. He smokes one pack per day and has been doing so for at least 35 years. He does not have family history of coronary artery disease. He presented yesterday with acute onset of chest pain in the lower chest and upper epigastric area. The pain was described as aching with sharp discomfort radiating to his back. It was described as a pulsatile feeling. The pain was very severe and was rated at 10 out of 10 in intensity. It has been associated with nausea and poor appetite. The patient was found to be severely hypertensive on presentation to the emergency room. CT scan showed intramural aortic hematoma affecting the descending aorta with possible distal dissection and penetrating ulcer. The patient's blood pressure is still not optimally controlled in spite of of nitroprusside drip. Before his illness, he never had any chest discomfort and no significant shortness of breath. He was able to do all his yard work and activities around the house without significant limitations.    Past Medical History:  Diagnosis Date  . Hepatitis C     Past Surgical History:    Procedure Laterality Date  . 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 0.9 %  sodium chloride infusion  250 mL Intravenous PRN Alexis Hugelmeyer, DO 10 mL/hr at 10/11/15 0600 250 mL at 10/11/15 0600  . antiseptic oral rinse (CPC / CETYLPYRIDINIUM CHLORIDE 0.05%) solution 7 mL  7 mL Mouth Rinse BID Ramonita Lab, MD      . famotidine (PEPCID) IVPB 20 mg premix  20 mg Intravenous Q12H Alexis Hugelmeyer, DO   20 mg at 10/11/15 0909  . hydrALAZINE (APRESOLINE) tablet 25 mg  25 mg Oral Q8H Iran Ouch, MD   25 mg at 10/11/15 0908  . HYDROmorphone (DILAUDID) injection 1 mg  1 mg Intravenous Q4H PRN Alexis Hugelmeyer, DO   1 mg at 10/11/15 0802  . losartan (COZAAR) tablet 50 mg  50 mg Oral Daily Iran Ouch, MD   50 mg at 10/11/15 0908  . nitroPRUSSide (NIPRIDE) 50 mg in dextrose 5 % 250 mL (0.2 mg/mL) infusion  0-3 mcg/kg/min Intravenous Titrated Alexis Hugelmeyer, DO 79 mL/hr at 10/11/15 0641 3 mcg/kg/min at 10/11/15 0641  . ondansetron (ZOFRAN) injection 4 mg  4 mg Intravenous Q6H PRN Alexis Hugelmeyer, DO   4 mg at 10/11/15 0020  . [START ON 10/12/2015] pneumococcal 23 valent vaccine (PNU-IMMUNE) injection 0.5 mL  0.5  mL Intramuscular Tomorrow-1000 Alexis Hugelmeyer, DO        Allergies:   Review of patient's allergies indicates no known allergies.    Social History:  The patient  reports that he has been smoking Cigarettes.  He has been smoking about 1.00 pack per day. He has never used smokeless tobacco. He reports that he does not drink alcohol or use drugs.   Family History:  The patient's negative for premature coronary artery disease or aortic aneurysm.   ROS:  Please see the history of present illness.   Otherwise, review of systems are positive for none.   All other systems  are reviewed and negative.    PHYSICAL EXAM: VS:  BP (!) 164/88 (BP Location: Right Arm)   Pulse (!) 58   Temp 97.7 F (36.5 C) (Axillary)   Resp 18   Ht 6\' 1"  (1.854 m)   Wt 193 lb 9 oz (87.8 kg)   SpO2 96%   BMI 25.54 kg/m  , BMI Body mass index is 25.54 kg/m. GEN: Well nourished, well developed, in no acute distress  HEENT: normal  Neck: no JVD, carotid bruits, or masses Cardiac: RRR; no murmurs, rubs, or gallops,no edema  Respiratory:  clear to auscultation bilaterally, normal work of breathing GI: soft, nontender, nondistended, + BS MS: no deformity or atrophy  Skin: warm and dry, no rash Neuro:  Strength and sensation are intact Psych: euthymic mood, full affect   EKG:   Personally reviewed by me and showed: sinus bradycardia with left ventricular hypertrophy.incomplete right bundle branch block and left anterior fascicular block. Significant repolarization abnormalities. Repeat EKG showed more impressive anterolateral T wave changes.   Telemetry recently reviewed by me and showed: sinus bradycardia.  Recent Labs: 10/10/2015: ALT 80 10/11/2015: BUN 16; Creatinine, Ser 0.74; Hemoglobin 14.9; Magnesium 2.0; Platelets 139; Potassium 4.4; Sodium 137    Lipid Panel    Component Value Date/Time   CHOL 171 10/10/2015 1701   TRIG 386 (H) 10/10/2015 1701   HDL 28 (L) 10/10/2015 1701   CHOLHDL 6.1 10/10/2015 1701   VLDL 77 (H) 10/10/2015 1701   LDLCALC 66 10/10/2015 1701      Wt Readings from Last 3 Encounters:  10/11/15 193 lb 9 oz (87.8 kg)      Other studies Reviewed: Additional studies/ records that were reviewed today include: CT scan of the chest and abdomen. Review of the above records demonstrates: summarized above   ASSESSMENT AND PLAN:  1.  Preoperative cardiovascular evaluation for possible aortic surgery: Patient with no previous cardiac history and his functional capacity is very good before his current illness. I suspect that he has hypertension for  many years which was not treated. His EKG D shows significant left ventricular hypertrophy with repolarization abnormalities. The repeat EKG showed more impressive anterolateral T-wave inversion and thus I decided to repeat another troponin to ensure no active ischemia. An echocardiogram was ordered and will be reviewed. Given that patient's previous lack of any symptoms suggestive of cardiac ischemia, I suspect that he should be able to undergo aortic surgery from a cardiac standpoint at an overall low to moderate risk.  2. Intramural aortic hematoma with penetrating ulcer: The patient needs more aggressive blood pressure control. This is currently not achieved on nitroprusside drip. In a typical situation, treatment with a beta blocker is recommended. However, the patient is bradycardic with evidence of incomplete right bundle branch block and left anterior fascicular block. Thus, I do not recommend this at  the present time. I elected to add losartan and small dose hydralazine.  3. Tobacco use: I discussed with the patient the importance of smoking cessation.  4. Essential hypertension: The patient will require aggressive treatment of blood pressure.     Signed,  Lorine Bears, MD  10/11/2015 9:33 AM    [facility]

## 2015-10-11 NOTE — Progress Notes (Signed)
eLink Physician-Brief Progress Note Patient Name: Cory Wilson DOB: 01/05/1951 MRN: [id number] Blanks4   Date of Service  10/11/2015  HPI/Events of Note  Patient c/o increased WOB and wheezing. BP 184/84. Nipride IV infusion now on hold d/t infusion running out and [facility]. [facility]   eICU Interventions  Will order: 1. Lasix 40 mg IV now.  2. Albuterol 2.5 mg via neb now and Q 4 hours PRN. 3. Nicardipine IV infusion. Titrate to SBP 100-120 until Nipride available.     Intervention Category Major Interventions: Hypertension - evaluation and management  Rossie Bretado Eugene 10/11/2015, 8:53 PM

## 2015-10-11 NOTE — Progress Notes (Signed)
Nurse called to room by patient. Upon entering pt found to have increased work of breathing from previous assessment. Pt stated he "felt like he could not get enough air in." Lung sounds auscultated and increased expiratory wheezes heard.  E-link notified. At 2040 of pt increase WOB and expiratory wheezes.   Pt's BP resulted at 185/84 (111). PRN Labetalol 20 mg pushed.PT HR before Labetalol pushed was 85. HR after push found at 72.  Awaiting call back from E-link, will continue to assess.

## 2015-10-11 NOTE — Consult Note (Signed)
[facility] Vascular Consult Note  MRN : [id number]  Cory Wilson is a 65 y.o. ([birth year]) male who presents with chief complaint of  Chief Complaint  Patient presents with  . Chest Pain  . Abdominal Pain  .  History of Present Illness: I am asked by Dr. Inocencio Homes to see the patient regarding his aortic pathology.  The patient presents with chest pain and abdominal pain. The pain has improved since his blood pressure has come under better control. He reports that starting yesterday out of the blue. He had no known previous knowledge of aneurysmal disease, but a CT angiogram was performed which I have independently reviewed. This has a very complex finding of what appears to be a penetrating ulcer with an intramural hematoma in the proximal descending thoracic aorta at the left subclavian artery. This continues down through the thoracic artery which is dilated and aneurysmal through the visceral vessels and he then has about a 5 cm infrarenal aortic aneurysm component. There is no evidence of extravasation or rupture. The patient has remained hemodynamically stable and again his pain is much better today than it was last night. He has no signs of lower extremity ischemia. He has no signs of bowel ischemia.  Current Facility-Administered Medications  Medication Dose Route Frequency Provider Last Rate Last Dose  . 0.9 %  sodium chloride infusion  250 mL Intravenous PRN Alexis Hugelmeyer, DO 10 mL/hr at 10/11/15 0600 250 mL at 10/11/15 0600  . 0.9 %  sodium chloride infusion   Intravenous Continuous Adrian Saran, MD 100 mL/hr at 10/11/15 1456    . antiseptic oral rinse (CPC / CETYLPYRIDINIUM CHLORIDE 0.05%) solution 7 mL  7 mL Mouth Rinse BID Ramonita Lab, MD      . famotidine (PEPCID) IVPB 20 mg premix  20 mg Intravenous Q12H Alexis Hugelmeyer, DO   20 mg at 10/11/15 0909  . hydrALAZINE (APRESOLINE) tablet 25 mg  25 mg Oral Q8H Iran Ouch, MD   25 mg at 10/11/15 1333  .  HYDROmorphone (DILAUDID) injection 1 mg  1 mg Intravenous Q2H PRN Adrian Saran, MD   1 mg at 10/11/15 1437  . labetalol (NORMODYNE,TRANDATE) injection 20 mg  20 mg Intravenous Q4H PRN Iran Ouch, MD   20 mg at 10/11/15 1442  . losartan (COZAAR) tablet 50 mg  50 mg Oral Daily Iran Ouch, MD   50 mg at 10/11/15 0908  . nitroPRUSSide (NIPRIDE) 50 mg in dextrose 5 % 250 mL (0.2 mg/mL) infusion  0-3 mcg/kg/min Intravenous Titrated Alexis Hugelmeyer, DO 79 mL/hr at 10/11/15 0938 3 mcg/kg/min at 10/11/15 0938  . ondansetron (ZOFRAN) injection 4 mg  4 mg Intravenous Q6H PRN Alexis Hugelmeyer, DO   4 mg at 10/11/15 1453  . [START ON 10/12/2015] pneumococcal 23 valent vaccine (PNU-IMMUNE) injection 0.5 mL  0.5 mL Intramuscular Tomorrow-1000 Alexis Hugelmeyer, DO      . senna (SENOKOT) tablet 8.6 mg  1 tablet Oral Daily Adrian Saran, MD   8.6 mg at 10/11/15 1029    Past Medical History:  Diagnosis Date  . Hepatitis C     Past Surgical History:  Procedure Laterality Date  . 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 Pt states he caught something on deployment to [place]    Social History Social History  Substance Use Topics  . Smoking status: Current Every Day Smoker    Packs/day: 1.00    Types:  Cigarettes  . Smokeless tobacco: Never Used  . Alcohol use No  No IVDU  Family History No history of bleeding disorders, clotting disorders, aneurysms, or autoimmune diseases  No Known Allergies   REVIEW OF SYSTEMS (Negative unless checked)  Constitutional: Weight loss  Fever  Chills Cardiac: Chest pain   Chest pressure   Palpitations   Shortness of breath when laying flat   Shortness of breath at rest   Shortness of breath with exertion. Vascular:  Pain in legs with walking   Pain in legs at rest   Pain in legs when laying flat   Claudication   Pain in feet when walking  Pain in feet at rest  Pain in feet when laying  flat   History of DVT   Phlebitis   Swelling in legs   Varicose veins   Non-healing ulcers Pulmonary:   Uses home oxygen   Productive cough   Hemoptysis   Wheeze  COPD   Asthma Neurologic:  Dizziness  Blackouts   Seizures   History of stroke   History of TIA  Aphasia   Temporary blindness   Dysphagia   Weakness or numbness in arms   Weakness or numbness in legs Musculoskeletal:  Arthritis   Joint swelling   Joint pain   Low back pain Hematologic:  Easy bruising  Easy bleeding   Hypercoagulable state   Anemic  Hepatitis Gastrointestinal:  Blood in stool   Vomiting blood  Gastroesophageal reflux/heartburn   Difficulty swallowing. Genitourinary:  Chronic kidney disease   Difficult urination  Frequent urination  Burning with urination   Blood in urine Skin:  Rashes   Ulcers   Wounds Psychological:  History of anxiety    History of major depression.  Physical Examination  Vitals:   10/11/15 1300 10/11/15 1330 10/11/15 1400 10/11/15 1410  BP: (!) 166/128 (!) 167/94 (!) 153/113 (!) 173/93  Pulse: 63 67 66 65  Resp: 19 13 (!) 21 18  Temp:    98.7 F (37.1 C)  TempSrc:    Oral  SpO2: 91% 92% 94% 94%  Weight:      Height:       Body mass index is 25.54 kg/m. Gen:  WD/WN, NAD Head: [place]/AT, No temporalis wasting. Prominent temp pulse not noted. Ear/Nose/Throat: Hearing grossly intact, nares w/o erythema or drainage, oropharynx w/o Erythema/Exudate Eyes: PERRLA, EOMI.  Neck: Supple, no nuchal rigidity.  No JVD.  Pulmonary:  Good air movement, equal bilaterally.  Cardiac: RRR, normal S1, S2. Vascular:  Vessel Right Left  Radial Palpable Palpable  Ulnar Palpable Palpable  Brachial Palpable Palpable  Carotid Palpable, without bruit Palpable, without bruit  Aorta Not palpable N/A  Femoral Palpable Palpable  Popliteal Palpable Palpable  PT Palpable Palpable  DP Palpable Palpable   Gastrointestinal: soft,  non-tender/non-distended. No guarding/reflex. No masses, surgical incisions, or scars. Musculoskeletal: M/S 5/5 throughout.  Extremities without ischemic changes.  No deformity or atrophy. No edema. Neurologic: CN 2-12 intact. Pain and light touch intact in extremities.  Symmetrical.  Speech is fluent. Motor exam as listed above. Psychiatric: Judgment intact, Mood & affect appropriate for pt's clinical situation. Dermatologic: No rashes or ulcers noted.  No cellulitis or open wounds. Lymph : No Cervical, Axillary, or Inguinal lymphadenopathy.      CBC Lab Results  Component Value Date   WBC 12.3 (H) 10/11/2015   HGB 14.9 10/11/2015   HCT 42.4 10/11/2015   MCV 87.5 10/11/2015   PLT 139 (L) 10/11/2015  BMET    Component Value Date/Time   NA 137 10/11/2015 0424   K 4.4 10/11/2015 0424   CL 102 10/11/2015 0424   CO2 29 10/11/2015 0424   GLUCOSE 169 (H) 10/11/2015 0424   BUN 16 10/11/2015 0424   CREATININE 0.74 10/11/2015 0424   CALCIUM 9.0 10/11/2015 0424   GFRNONAA >60 10/11/2015 0424   GFRAA >60 10/11/2015 0424   Estimated Creatinine Clearance: 105.4 mL/min (by C-G formula based on SCr of 0.8 mg/dL).  COAG No results found for: INR, PROTIME  Radiology Dg Chest Portable 1 View  Result Date: 10/10/2015 CLINICAL DATA:  65 year old male with history of chest pain since 1 p.m. today, with radiation to the right side. Emesis. No associated shortness of breath. Hypertension. EXAM: PORTABLE CHEST 1 VIEW COMPARISON:  Chest x-ray 09/02/2007. FINDINGS: Multiple tiny calcified granulomas are noted, most evident in the left lower lobe. Calcified left hilar lymph nodes. Lung volumes are normal. No consolidative airspace disease. No pleural effusions. No pneumothorax. No pulmonary nodule or mass noted. Pulmonary vasculature and the cardiomediastinal silhouette are within normal limits. IMPRESSION: No radiographic evidence of acute cardiopulmonary disease. Electronically Signed   By: Trudie Reed M.D.   On: 10/10/2015 17:43   Ct Angio Chest/abd/pel For Dissection W And/or Wo Contrast  Result Date: 10/10/2015 CLINICAL DATA:  Acute onset of epigastric pain today. Clinical concern for aortic dissection. EXAM: CT ANGIOGRAPHY CHEST, ABDOMEN AND PELVIS TECHNIQUE: Multidetector CT imaging through the chest, abdomen and pelvis was performed using the standard protocol during bolus administration of intravenous contrast. Multiplanar reconstructed images and MIPs were obtained and reviewed to evaluate the vascular anatomy. CONTRAST:  100 cc Isovue 370 IV COMPARISON:  No prior comparison exams. FINDINGS: CTA CHEST FINDINGS Vascular: Examination is positive for acute aortic syndrome with intramural hematoma arising just distal a takeoff of the great vessels extending to the level of the diaphragmatic hiatus. The intramural hematoma is hyperdense on noncontrast imaging. Small focus of extraluminal contrast is seen within the is intramural hematoma in the distal thoracic aorta image 75 series 2 vein may be a penetrating ulcer. There is subsequent aneurysmal dilatation of the descending thoracic aorta measuring up to 4 cm. The aorta at the level of the diaphragmatic hiatus measures 3.6 x 3.9 cm. No periaortic soft tissue stranding. There is a conventional branching pattern from the aortic arch. There is atherosclerotic ossified noncalcified plaque at the origins of the great vessels, without hematoma involvement. There is diffuse calcified and noncalcified atheromatous plaque. No central pulmonary embolus. There are coronary artery calcifications. Mediastinum: No enlarged mediastinal or hilar lymph nodes. No pericardial fluid. Lungs/pleura: Biapical pleural-parenchymal scarring. Mild emphysema. No confluent airspace disease. No evidence pulmonary edema. Trachea and mainstem bronchi are patent. There is no pleural effusion or pleural fluid. Musculoskeletal: There are no acute or suspicious osseous  abnormalities. Multilevel degenerative change throughout the spine with multiple Schmorl's nodes. Review of the MIP images confirms the above findings. CTA ABDOMEN AND PELVIS FINDINGS Vascular: Fusiform infrarenal abdominal aortic aneurysm, proximal aspect less than 1 cm from the takeoff of the left renal artery. Maximal dimension 5.0 x 4.7 cm. Distal extent just proximal to the iliac bifurcation. There is peripherally eccentric thrombus about the right lateral aspect with a thin band of thrombus extending into the opacified lumen versus limited short-segment dissection. There is no periaortic soft tissue stranding. 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 Normal caliber left common iliac artery. The celiac, superior mesenteric,  and inferior mesenteric arteries are patent. There is an accessory artery arising from the most abdominal aorta supplying the lesser curvature of the stomach. Single bilateral renal arteries are patent. There is diffuse calcified and noncalcified atheromatous plaque. Hepatobiliary: The liver is prominent size measuring 21 cm cranial caudal. No focal abnormality detected on arterial phase imaging. Gallbladder physiologically distended. No calcified stone. Pancreas:  No ductal dilatation or inflammation. Spleen:  Normal in size.  Normal arterial phase imaging appearance. Adrenals:  No nodule. Kidneys: Symmetric enhancement. No hydronephrosis. Cyst in the upper left kidney. Stomach/bowel: Distal esophageal wall thickening with small hiatal hernia. Stomach physiologically distended. Suboptimal bowel assessment given lack of enteric contrast. No bowel dilatation. Moderate stool burden throughout the colon. Sigmoid colonic diverticulosis without diverticulitis. Appendix is normal. Lymphatics: No evidence of retroperitoneal, mesenteric or pelvic adenopathy. Bladder: Distended. Reproductive: Central prostatic calcifications. No acute abnormality. Other:  No free air  free fluid.  No intra-abdominal abscess. Musculoskeletal: There are no acute or suspicious osseous abnormalities. Degenerative change throughout the lumbar spine, with endplate changes at L5-S1. Review of the MIP images confirms the above findings. IMPRESSION: 1. Positive for acute aortic syndrome in the chest with aortic hematoma originating just distal to the great vessels. 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 No periaortic soft tissue stranding. 2. Infrarenal abdominal aorta measuring 5.0 x 4.7 cm. Eccentric mural thrombus with thin band of thrombus versus short-segment dissection in the infrarenal portion. No evidence of aortic rupture. Recommend followup by abdomen and pelvis CTA in 3-6 months, and vascular surgery referral/consultation if not already obtained. This recommendation follows ACR consensus guidelines: White Paper of the ACR Incidental Findings Committee II on Vascular Findings. J Am Coll Radiol 2013; 10:789-794. 3. 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 4. Incidental chest findings of emphysema and coronary artery calcifications. 5. Incidental abdomen/pelvis findings of sigmoid colonic diverticulosis without evidence of diverticulitis. Critical Value/emergent preliminary results were discussed by telephone at the time of interpretation on 10/10/2015 at 7:20 pm with Dr. Toney RakesERYKA GAYLE , who verbally acknowledged these results. Electronically Signed   By: Rubye OaksMelanie  Ehinger M.D.   On: 10/10/2015 19:46   [place] Abdomen Limited Ruq  Result Date: 10/10/2015 CLINICAL DATA:  Right upper quadrant pain for several hours, initial encounter EXAM: US ABDOMEN LIMITED - RIGHT UPPER QUADRANT COMPARISON:  None. FINDINGS: Gallbladder: Gallbladder is well distended with evidence of gallbladder sludge. Some nondependent echogenicities are noted along the gallbladder wall consistent with gallbladder polyps. Tiny  gallstones are noted as well. No significant pericholecystic fluid is noted. No wall thickening is seen. Common bile duct: Diameter: 3.3 mm. Liver: No focal lesion identified. Within normal limits in parenchymal echogenicity. Incidental note is made of dilatation of the mid infrarenal aorta. The maximum dimension is 5.3 cm. Echogenic line is noted within the aorta with color flow on both sides suggestive of dissection. IMPRESSION: Small gallstones, gallbladder polyps in gallbladder sludge. No sonographic Eulah PontMurphy sign is noted. Changes suggestive of aortic aneurysm with possible dissection. A CT is already scheduled for further evaluation of this abnormality. Electronically Signed   By: Alcide CleverMark  Lukens M.D.   On: 10/10/2015 18:55     Assessment/Plan 1. 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  Very complex scenario especially since the hematoma originates at the left subclavian artery.  This will require a very complex thoracoabdominal repair  or a deep branching procedure with a fenestrated stent graft. These are not procedures that we perform at this hospital. I would recommend referral to a tertiary care center. I have discussed this with the patient. He seems to voices understanding. 2. Hypertension. Needs very tight control with the above findings. Would aim for systolic blood pressures in the 100-120 range. 3. Tobacco dependence.  Likely an underlying cause of his aortic disease.  Cessation recommended.   DEW,JASON, MD  10/11/2015 2:57 PM

## 2015-10-11 NOTE — Progress Notes (Signed)
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 Patient reports that he had a pulsatile pain.  REVIEW OF SYSTEMS:    Review of Systems  Constitutional: Negative.  Negative for chills, fever and malaise/fatigue.  HENT: Negative.  Negative for ear discharge, ear pain, hearing loss, nosebleeds and sore throat.   Eyes: Negative.  Negative for blurred vision and pain.  Respiratory: Negative.  Negative for cough, hemoptysis, shortness of breath and wheezing.   Cardiovascular: Negative.  Negative for chest pain, palpitations and leg swelling.       Pulsatile pain midepigastric region  Gastrointestinal: Negative.  Negative for abdominal pain, blood in stool, diarrhea, nausea and vomiting.  Genitourinary: Negative.  Negative for dysuria.  Musculoskeletal: Negative.  Negative for back pain.  Skin: Negative.   Neurological: Negative for dizziness, tremors, speech change, focal weakness, seizures and headaches.  Endo/Heme/Allergies: Negative.  Does not bruise/bleed easily.  Psychiatric/Behavioral: Negative.  Negative for depression, hallucinations and suicidal ideas.    Tolerating Diet: yes      DRUG ALLERGIES:  No Known Allergies  VITALS:  Blood pressure (!) 155/86, pulse (!) 58, temperature 97.7 F (36.5 C), temperature source Axillary, resp. rate (!) 21, height  (1.854 m), weight 87.8 kg (193 lb 9 oz), SpO2 95 %.  PHYSICAL EXAMINATION:   Physical Exam    LABORATORY PANEL:   CBC  Recent Labs Lab 10/11/15 0424  WBC 12.3*  HGB 14.9  HCT 42.4  PLT 139*   ------------------------------------------------------------------------------------------------------------------  Chemistries   Recent Labs Lab 10/10/15 1701  10/11/15 0424  NA 136 137  K 3.8 4.4  CL 103 102  CO2 24 29  GLUCOSE 115* 169*  BUN 14 16  CREATININE 0.67 0.74  CALCIUM 9.5 9.0  MG 2.0 2.0  AST 79*  --   ALT 80*  --   ALKPHOS 72  --   BILITOT 0.6  --    ------------------------------------------------------------------------------------------------------------------  Cardiac Enzymes  Recent Labs Lab 10/10/15 1701 10/11/15 0909  TROPONINI <0.03 <0.03   ------------------------------------------------------------------------------------------------------------------  RADIOLOGY:  Dg Chest Portable 1 View  Result Date: 10/10/2015 CLINICAL DATA:  65 year old male with history of chest pain since 1 p.m. today, with radiation to the right side. Emesis. No associated shortness of breath. Hypertension. EXAM: PORTABLE CHEST 1 VIEW COMPARISON:  Chest x-ray 09/02/2007. FINDINGS: Multiple tiny calcified granulomas are noted, most evident in the left lower lobe. Calcified left hilar lymph nodes. Lung volumes are normal. No consolidative airspace disease. No pleural effusions. No pneumothorax. No pulmonary nodule or mass noted. Pulmonary vasculature and the cardiomediastinal silhouette are within normal limits. IMPRESSION: No radiographic evidence of acute cardiopulmonary disease. Electronically Signed   By: Trudie Reed M.D.   On: 10/10/2015 17:43   Ct Angio Chest/abd/pel For Dissection W And/or Wo Contrast  Result Date: 10/10/2015 CLINICAL DATA:  Acute onset of epigastric pain today. Clinical concern for aortic dissection. EXAM: CT ANGIOGRAPHY CHEST, ABDOMEN AND PELVIS TECHNIQUE: Multidetector CT imaging through the chest, abdomen and pelvis was performed using the standard protocol during bolus administration of intravenous contrast. Multiplanar reconstructed images and MIPs were obtained and reviewed to evaluate the vascular anatomy. CONTRAST:  100 cc Isovue 370 IV COMPARISON:  No prior comparison exams. FINDINGS: CTA CHEST FINDINGS  Vascular: Examination is positive for acute aortic syndrome with intramural hematoma arising just distal a takeoff of the great vessels extending to the level of the diaphragmatic hiatus. The intramural hematoma is hyperdense on noncontrast imaging. Small focus of extraluminal contrast is seen within the is intramural hematoma in the distal thoracic aorta image 75 series 2 vein may be a penetrating ulcer. There is subsequent aneurysmal dilatation of the descending thoracic aorta measuring up to 4 cm. The aorta at the level of the diaphragmatic hiatus measures 3.6 x 3.9 cm. No periaortic soft tissue stranding. There is a conventional branching pattern from the aortic arch. There is atherosclerotic ossified noncalcified plaque at the origins of the great vessels, without hematoma involvement. There is diffuse calcified and noncalcified atheromatous plaque. No central pulmonary embolus. There are coronary artery calcifications. Mediastinum: No enlarged mediastinal or hilar lymph nodes. No pericardial fluid. Lungs/pleura: Biapical pleural-parenchymal scarring. Mild emphysema. No confluent airspace disease. No evidence pulmonary edema. Trachea and mainstem bronchi are patent. There is no pleural effusion or pleural fluid. Musculoskeletal: There are no acute or suspicious osseous abnormalities. Multilevel degenerative change throughout the spine with multiple Schmorl's nodes. Review of the MIP images confirms the above findings. CTA ABDOMEN AND PELVIS FINDINGS Vascular: Fusiform infrarenal abdominal aortic aneurysm, proximal aspect less than 1 cm from the takeoff of the left renal artery. Maximal dimension 5.0 x 4.7 cm. Distal extent just proximal to the iliac bifurcation. There is peripherally eccentric thrombus about the right lateral aspect with a thin band of thrombus extending into the opacified lumen versus limited short-segment dissection. There is no periaortic soft tissue stranding. Aneurysmal dilatation of the  right common iliac artery measuring 2.5 cm. Normal caliber left common iliac artery. The celiac, superior mesenteric, and inferior mesenteric arteries are patent. There is an accessory artery arising from the most abdominal aorta supplying the lesser curvature of the stomach. Single bilateral renal arteries are patent. There is diffuse calcified and noncalcified atheromatous plaque. Hepatobiliary: The liver is prominent size measuring 21 cm cranial caudal. No focal abnormality detected on arterial phase imaging. Gallbladder physiologically distended. No calcified stone. Pancreas:  No ductal dilatation or inflammation. Spleen:  Normal in size.  Normal arterial phase imaging appearance. Adrenals:  No nodule. Kidneys: Symmetric enhancement. No hydronephrosis. Cyst in the upper left kidney. Stomach/bowel: Distal esophageal wall thickening with small hiatal hernia. Stomach physiologically distended. Suboptimal bowel assessment given lack of enteric contrast. No bowel dilatation. Moderate stool burden throughout the colon. Sigmoid colonic diverticulosis without diverticulitis. Appendix is normal. Lymphatics: No evidence of retroperitoneal, mesenteric or pelvic adenopathy. Bladder: Distended. Reproductive: Central prostatic calcifications. No acute abnormality. Other:  No free air free fluid.  No intra-abdominal abscess. Musculoskeletal: There are no acute or suspicious osseous abnormalities. Degenerative change throughout the lumbar spine, with endplate changes at L5-S1. Review of the MIP images confirms the above findings. IMPRESSION: 1. Positive for acute aortic syndrome in the chest with aortic hematoma originating just distal to the great vessels. 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 No periaortic soft tissue stranding. 2. Infrarenal abdominal aorta measuring 5.0 x 4.7 cm. Eccentric mural thrombus with thin band of thrombus versus  short-segment dissection in the infrarenal portion. No evidence of aortic rupture. Recommend followup by abdomen and pelvis CTA in 3-6 months, and vascular surgery referral/consultation if not already obtained. This recommendation follows ACR consensus guidelines: White Paper of the ACR Incidental Findings Committee II on Vascular Findings. J Am  Coll Radiol 2013; 10:789-794. 3. 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 4. Incidental chest findings of emphysema and coronary artery calcifications. 5. Incidental abdomen/pelvis findings of sigmoid colonic diverticulosis without evidence of diverticulitis. Critical Value/emergent preliminary results were discussed by telephone at the time of interpretation on 10/10/2015 at 7:20 pm with Dr. Toney RakesERYKA GAYLE , who verbally acknowledged these results. Electronically Signed   By: Rubye OaksMelanie  Ehinger M.D.   On: 10/10/2015 19:46   [place] Abdomen Limited Ruq  Result Date: 10/10/2015 CLINICAL DATA:  Right upper quadrant pain for several hours, initial encounter EXAM: US ABDOMEN LIMITED - RIGHT UPPER QUADRANT COMPARISON:  None. FINDINGS: Gallbladder: Gallbladder is well distended with evidence of gallbladder sludge. Some nondependent echogenicities are noted along the gallbladder wall consistent with gallbladder polyps. Tiny gallstones are noted as well. No significant pericholecystic fluid is noted. No wall thickening is seen. Common bile duct: Diameter: 3.3 mm. Liver: No focal lesion identified. Within normal limits in parenchymal echogenicity. Incidental note is made of dilatation of the mid infrarenal aorta. The maximum dimension is 5.3 cm. Echogenic line is noted within the aorta with color flow on both sides suggestive of dissection. IMPRESSION: Small gallstones, gallbladder polyps in gallbladder sludge. No sonographic Eulah PontMurphy sign is noted. Changes suggestive of aortic aneurysm with possible dissection. A CT is already scheduled for further evaluation of this  abnormality. Electronically Signed   By: Alcide CleverMark  Lukens M.D.   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   1. Intramural aortic hematoma penetrating ulcer: Patient needs good blood pressure control. I do a systolic blood pressure should be within the range of 100-120. Patient is unable to take beta blocker due to low heart rate/incomplete right bundle branch block and left anterior fascicular block. Continue nitroprusside drip, losartan and hydralazine. As per vascular surgery patient will need to be referred to tertiary care unit after blood pressure is controlled. I will adjust pain medications for better pain control which may assist to decrease blood pressure as well. 2. tobacco dependence: Patient was encouraged assessment. Patient counseled for 3 minutes.  3. Essential hypertension: Continue aggressive treatment for blood pressure due to problem #1.       Management plans discussed with the patient and he is in agreement.  CODE STATUS: full  CRITICAL CARETOTAL TIME TAKING CARE OF THIS PATIENT: 40 minutes.     POSSIBLE D/C 2-3 days, DEPENDING ON CLINICAL CONDITION.   Raizel Wesolowski M.D on 10/11/2015 at 11:21 AM  Between 7am to 6pm - Pager - [phone] After 6pm go to [email or web address] - Social research officer, governmentpassword [email or web address]  [facility]  Office  [phone]  CC: Primary care physician; No primary care provider on file.  Note: This dictation was prepared with Dragon dictation along with smaller phrase technology. Any transcriptional errors that result from this process are unintentional.

## 2015-10-11 NOTE — Progress Notes (Addendum)
eLink Physician-Brief Progress Note Patient Name: Cory BlanksRobert D Wilson DOB: 06/23/1950 MRN: [id number]   Patient maxed on nipride gtt per RN and sbp still not at goal of 100-120 sbp Also bradycardic Plan Hydralazine prn - avoid after d/w pharmacy D/w pharmacy - increase nitroprusside range    Intervention Category Major Interventions: Hypertension - evaluation and management  Kaniesha Barile 10/11/2015, 1:40 AM

## 2015-10-11 NOTE — Care Management (Addendum)
Received call from [employer]ona Lambert ext. [phone] with patient financial counseling stating that [facility]. I have left message for Selinda [facility]inda Hunt [facility] if [facility] and if patient is affiliated with them.

## 2015-10-11 NOTE — Progress Notes (Signed)
Spoke with MD Mody during bedside rounds that patient felt like pain medication was not lasting long enough. Requested that the frequency of the current pain medication be changed. Also notified MD Mody that patient was on maximum dose of Nipride drip and SBP was still not within the parameters. See new orders. Will continue to monitor patient.

## 2015-10-11 NOTE — Progress Notes (Signed)
eLink Physician-Brief Progress Note Patient Name: Cory BlanksRobert D Wilson DOB: 04/07/1950 MRN: [id number]   Date of Service  10/11/2015  HPI/Events of Note  Penetrating Aortic Ulcer with intramural hematoma. Goal SBP = 100 - 120. Already on Multiple agents including Nipride IV infusion at ceiling.  eICU Interventions  Will order: 1. Hydralazine 10 mg IV Q 4 hours PRN SBP > 130.     Intervention Category Major Interventions: Hypertension - evaluation and management  Sommer,Steven Eugene 10/11/2015, 5:47 PM

## 2015-10-11 NOTE — Progress Notes (Signed)
Paged MD Mody about patient's consistently elevated BP even with administering oral medications the cardiologist started this morning. Also mentioned to MD Mody that patient had only voided 27100mL's of dark concentrated urine.   MD Mody ordered to start normal saline at 100 mL's/hour and to call MD Arida about blood pressure concern.  Paged MD Kirke CorinArida to notify of consistent elevated BP while being on Nipride drip and administering new oral medications. Also notified MD Kirke Corinrida that patient was complaining of a crushing headache and ringing in his ears. MD Kirke Corinrida stated to order IV labetalol 20 mg every 4 hours for SBP greater than 150. Notified MD Kirke Corinrida that patients current HR was in the low 60's.  See new orders. Will continue to monitor patient.

## 2015-10-11 NOTE — Progress Notes (Signed)
eLink Physician-Brief Progress Note Patient Name: Cory BlanksRobert D Wilson DOB: 02/16/1951 MRN: [id number]   Date of Service  10/11/2015  HPI/Events of Note  Request for hold orders for Labetalol dose.   eICU Interventions  Hold Labetalol IV PRN for HR < 60.     Intervention Category Major Interventions: Other:  Lenell AntuSommer,Lucky Trotta Eugene 10/11/2015, 6:21 PM

## 2015-10-11 NOTE — Progress Notes (Signed)
eLink Physician-Brief Progress Note Patient Name: Cory BlanksRobert D Inskeep DOB: 03/09/1950 MRN: [id number]   Date of Service  10/11/2015  HPI/Events of Note  Epigastric pain  eICU Interventions  cotnniue pepcid Add ppi Gi cocktail x 1     Intervention Category Major Interventions: Other: Intermediate Interventions: Abdominal pain - evaluation and management  Jonessa Triplett 10/11/2015, 11:59 PM

## 2015-10-11 NOTE — H&P (Signed)
---------------------------------------------------------------------------------------------------------------------   PATIENT NAME: Cory Wilson MR#: [id number] DATE OF BIRTH: 04/13/1950 DATE OF ADMISSION: 10/10/2015 PRIMARY CARE PHYSICIAN: No primary care provider on file.  REQUESTING/REFERRING PHYSICIAN: ED Dr. Darnelle CatalanMalinda  CHIEF COMPLAINT: Chief Complaint  Patient presents with  . Chest Pain  . Abdominal Pain    HISTORY OF PRESENT ILLNESS: Cory DingwallRobert Wilson is a 65 y.o. male with history of hepatitis C started having 10/10 "pulsating" chest pain in the lower mid-sternum association with nausea, dry heaves, shortness of breath and diaphoresis. -  Pain was relieved after EMS placed nitropaste.  - He states that he has had "heartburn" in the same region intermittently over the past few months which seemed to have been relieved by Rolaids.   -He has not had a physical exam in the past 7 years, and has never had a cardiac workup.    Otherwise there has been no change in status. There has been no recent change in medication or diet.  There has been no recent illness, travel or sick contacts.    Patient denies fevers/chills, weakness, dizziness, C/D, dysuria/frequency, changes in mental status.   EMS/ED COURSE:  Patient rec'd nitropaste in the ambulance was started on a nitroprusside drip in the ED for elevated BP  CTA  Findings as reviewed below  1. Positive for acute aortic syndrome in the chest with aortic hematoma originating just distal to the great vessels. 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 No periaortic soft tissue stranding. 2. Infrarenal abdominal aorta measuring 5.0 x 4.7 cm. Eccentric mural thrombus with thin band of thrombus versus short-segment dissection in the infrarenal portion. No evidence of aortic rupture. 3. 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  PAST  MEDICAL HISTORY: Past Medical History:  Diagnosis Date  . Hepatitis C       PAST SURGICAL HISTORY: Past Surgical History:  Procedure Laterality Date  . 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 Pt states he caught something on deployment to [place]      SOCIAL HISTORY: Social History  Substance Use Topics  . Smoking status: Current Every Day Smoker    Packs/day: 1.00    Types: Cigarettes  . Smokeless tobacco: Never Used  . Alcohol use No      FAMILY HISTORY: History reviewed. No pertinent family history.   MEDICATIONS AT HOME: Prior to Admission medications   Medication Sig Start Date End Date Taking? Authorizing Provider  aspirin EC 81 MG tablet Take 81 mg by mouth daily.   Yes Historical Provider, MD  Fish Oil-Cholecalciferol (FISH OIL + D3 PO) Take 1 capsule by mouth daily.   Yes Historical Provider, MD  Multiple Vitamins-Minerals (MULTIVITAMIN WITH MINERALS) tablet Take 1 tablet by mouth daily.   Yes Historical Provider, MD      DRUG ALLERGIES: No Known Allergies   REVIEW OF SYSTEMS: CONSTITUTIONAL: No fever/chills. No fatigue, weakness. No weight gain, no weight loss. EYES: No blurry or double vision. ENT: No tinnitus. No postnasal drip. No redness or soreness of the oropharynx. RESPIRATORY: No cough, no wheeze, no hemoptysis. No dyspnea. CARDIOVASCULAR:  (+)chest pain. No orthopnea. No palpitations. No syncope. GASTROINTESTINAL: (+) nausea, no vomiting or diarrhea. (+) abdominal pain. No melena or hematochezia. GENITOURINARY: No dysuria or hematuria. ENDOCRINE: No polyuria or nocturia. No heat or cold intolerance. HEMATOLOGY: No anemia. No bruising. No bleeding. INTEGUMENTARY: No rashes. No lesions. MUSCULOSKELETAL: No arthritis.  No swelling. No gout. NEUROLOGIC: No numbness, tingling, weakness or ataxia. No seizure-type activity. PSYCHIATRIC: No anxiety. No depression. No insomnia.  PHYSICAL EXAMINATION: VITAL  SIGNS: Blood pressure (!) 155/86, pulse (!) 58, temperature 97.7 F (36.5 C), temperature source Axillary, resp. rate (!) 21, height 6\' 1"  (1.854 m), weight 193 lb 9 oz (87.8 kg), SpO2 95 %.  GENERAL: 65 y.o.-year-old white male patient, well-developed, well-nourished lying in the bed in no acute distress. EYES: Pupils equal, round, reactive to light and accommodation. No scleral icterus. Extraocular muscles intact. HEENT: Head atraumatic, normocephalic. Oropharynx and nasopharynx clear. Mucus membranes moist. NECK: Supple, full range of motion. No JVD, no bruit heard. No thyroid enlargement, no tenderness, no lymphadenopathy. CHEST: Normal breath sounds bilaterally, no wheezing, rales, rhonchi or crepitation. No use of accessory muscles of respiration.  No reproducible chest wall tenderness.  CARDIOVASCULAR: S1, S2 normal. No murmurs, rubs, or gallops. Cap refill <2 seconds. ABDOMEN: Soft, nontender, nondistended. No palpable mass or bruit heard. No rebound, guarding, rigidity. Normoactive bowel sounds present in all four quadrants. No organomegaly or mass. EXTREMITIES: Full range of motion. No pedal edema, cyanosis, or clubbing. NEUROLOGIC: Cranial nerves II through XII are grossly intact with no focal sensorimotor deficit. Muscle strength 5/5 in all extremities. Sensation intact. Gait not checked. PSYCHIATRIC: The patient is alert and oriented x 3. Normal affect, mood, thought content. SKIN: Warm, dry, and intact without obvious rash, lesion, or ulcer.  LABORATORY PANEL:  CBC  Recent Labs Lab 10/11/15 0424  WBC 12.3*  HGB 14.9  HCT 42.4  PLT 139*   ----------------------------------------------------------------------------------------------------------------- Chemistries  Recent Labs Lab 10/10/15 1701 10/11/15 0424  NA 136 137  K 3.8 4.4  CL 103 102  CO2 24 29  GLUCOSE 115* 169*  BUN 14 16  CREATININE 0.67 0.74  CALCIUM 9.5 9.0  MG 2.0 2.0  AST 79*  --   ALT 80*  --    ALKPHOS 72  --   BILITOT 0.6  --    ------------------------------------------------------------------------------------------------------------------ Cardiac Enzymes  Recent Labs Lab 10/11/15 0909  TROPONINI <0.03   ------------------------------------------------------------------------------------------------------------------  RADIOLOGY: Dg Chest Portable 1 View  Result Date: 10/10/2015 CLINICAL DATA:  65 year old male with history of chest pain since 1 p.m. today, with radiation to the right side. Emesis. No associated shortness of breath. Hypertension. EXAM: PORTABLE CHEST 1 VIEW COMPARISON:  Chest x-ray 09/02/2007. FINDINGS: Multiple tiny calcified granulomas are noted, most evident in the left lower lobe. Calcified left hilar lymph nodes. Lung volumes are normal. No consolidative airspace disease. No pleural effusions. No pneumothorax. No pulmonary nodule or mass noted. Pulmonary vasculature and the cardiomediastinal silhouette are within normal limits. IMPRESSION: No radiographic evidence of acute cardiopulmonary disease. Electronically Signed   By: Trudie Reed M.D.   On: 10/10/2015 17:43   Ct Angio Chest/abd/pel For Dissection W And/or Wo Contrast  Result Date: 10/10/2015 CLINICAL DATA:  Acute onset of epigastric pain today. Clinical concern for aortic dissection. EXAM: CT ANGIOGRAPHY CHEST, ABDOMEN AND PELVIS TECHNIQUE: Multidetector CT imaging through the chest, abdomen and pelvis was performed using the standard protocol during bolus administration of intravenous contrast. Multiplanar reconstructed images and MIPs were obtained and reviewed to evaluate the vascular anatomy. CONTRAST:  100 cc Isovue 370 IV COMPARISON:  No prior comparison exams. FINDINGS: CTA CHEST FINDINGS Vascular: Examination is positive for acute aortic syndrome with intramural hematoma arising just distal a takeoff of the great vessels extending to the level of the diaphragmatic hiatus. The intramural  hematoma is  hyperdense on noncontrast imaging. Small focus of extraluminal contrast is seen within the is intramural hematoma in the distal thoracic aorta image 75 series 2 vein may be a penetrating ulcer. There is subsequent aneurysmal dilatation of the descending thoracic aorta measuring up to 4 cm. The aorta at the level of the diaphragmatic hiatus measures 3.6 x 3.9 cm. No periaortic soft tissue stranding. There is a conventional branching pattern from the aortic arch. There is atherosclerotic ossified noncalcified plaque at the origins of the great vessels, without hematoma involvement. There is diffuse calcified and noncalcified atheromatous plaque. No central pulmonary embolus. There are coronary artery calcifications. Mediastinum: No enlarged mediastinal or hilar lymph nodes. No pericardial fluid. Lungs/pleura: Biapical pleural-parenchymal scarring. Mild emphysema. No confluent airspace disease. No evidence pulmonary edema. Trachea and mainstem bronchi are patent. There is no pleural effusion or pleural fluid. Musculoskeletal: There are no acute or suspicious osseous abnormalities. Multilevel degenerative change throughout the spine with multiple Schmorl's nodes. Review of the MIP images confirms the above findings. CTA ABDOMEN AND PELVIS FINDINGS Vascular: Fusiform infrarenal abdominal aortic aneurysm, proximal aspect less than 1 cm from the takeoff of the left renal artery. Maximal dimension 5.0 x 4.7 cm. Distal extent just proximal to the iliac bifurcation. There is peripherally eccentric thrombus about the right lateral aspect with a thin band of thrombus extending into the opacified lumen versus limited short-segment dissection. There is no periaortic soft tissue stranding. 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 Normal caliber left common iliac artery. The celiac, superior mesenteric, and inferior mesenteric arteries are patent. There is an accessory artery arising from  the most abdominal aorta supplying the lesser curvature of the stomach. Single bilateral renal arteries are patent. There is diffuse calcified and noncalcified atheromatous plaque. Hepatobiliary: The liver is prominent size measuring 21 cm cranial caudal. No focal abnormality detected on arterial phase imaging. Gallbladder physiologically distended. No calcified stone. Pancreas:  No ductal dilatation or inflammation. Spleen:  Normal in size.  Normal arterial phase imaging appearance. Adrenals:  No nodule. Kidneys: Symmetric enhancement. No hydronephrosis. Cyst in the upper left kidney. Stomach/bowel: Distal esophageal wall thickening with small hiatal hernia. Stomach physiologically distended. Suboptimal bowel assessment given lack of enteric contrast. No bowel dilatation. Moderate stool burden throughout the colon. Sigmoid colonic diverticulosis without diverticulitis. Appendix is normal. Lymphatics: No evidence of retroperitoneal, mesenteric or pelvic adenopathy. Bladder: Distended. Reproductive: Central prostatic calcifications. No acute abnormality. Other:  No free air free fluid.  No intra-abdominal abscess. Musculoskeletal: There are no acute or suspicious osseous abnormalities. Degenerative change throughout the lumbar spine, with endplate changes at L5-S1. Review of the MIP images confirms the above findings. IMPRESSION: 1. Positive for acute aortic syndrome in the chest with aortic hematoma originating just distal to the great vessels. 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 No periaortic soft tissue stranding. 2. Infrarenal abdominal aorta measuring 5.0 x 4.7 cm. Eccentric mural thrombus with thin band of thrombus versus short-segment dissection in the infrarenal portion. No evidence of aortic rupture. Recommend followup by abdomen and pelvis CTA in 3-6 months, and vascular surgery referral/consultation if not already obtained.  This recommendation follows ACR consensus guidelines: White Paper of the ACR Incidental Findings Committee II on Vascular Findings. J Am Coll Radiol 2013; 10:789-794. 3. 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 4. Incidental chest findings of emphysema and coronary artery calcifications. 5. Incidental abdomen/pelvis findings of sigmoid  colonic diverticulosis without evidence of diverticulitis. Critical Value/emergent preliminary results were discussed by telephone at the time of interpretation on 10/10/2015 at 7:20 pm with Dr. Toney Rakes , who verbally acknowledged these results. Electronically Signed   By: Rubye Oaks M.D.   On: 10/10/2015 19:46   US Abdomen Limited Ruq  Result Date: 10/10/2015 CLINICAL DATA:  Right upper quadrant pain for several hours, initial encounter EXAM: US ABDOMEN LIMITED - RIGHT UPPER QUADRANT COMPARISON:  None. FINDINGS: Gallbladder: Gallbladder is well distended with evidence of gallbladder sludge. Some nondependent echogenicities are noted along the gallbladder wall consistent with gallbladder polyps. Tiny gallstones are noted as well. No significant pericholecystic fluid is noted. No wall thickening is seen. Common bile duct: Diameter: 3.3 mm. Liver: No focal lesion identified. Within normal limits in parenchymal echogenicity. Incidental note is made of dilatation of the mid infrarenal aorta. The maximum dimension is 5.3 cm. Echogenic line is noted within the aorta with color flow on both sides suggestive of dissection. IMPRESSION: Small gallstones, gallbladder polyps in gallbladder sludge. No sonographic Eulah Pont sign is noted. Changes suggestive of aortic aneurysm with possible dissection. A CT is already scheduled for further evaluation of this abnormality. Electronically Signed   By: Alcide Clever M.D.   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 Q waves in aVL, AVR, V1 and V2.   IMPRESSION AND  PLAN:  This is a 65 y.o. male  with a history of hepatitis C now being admitted with: -Acute aortic syndrome with hematoma with penetrating ulcer into Left Subclavian Vein After discussion with dr Wyn Quaker, patient needs extensive open repair-will need to establish care at tertiary care center -will need aggressive BP control, pain control - nitroprusside drip for BP control, with a goal systolic 100-120 - Will use Morphine for pain control, Zofran, Pepcid for GI prophylaxis.   -DVT Px will be with SCDs only given hematoma and high risk of bleeding.    At this time, goal if for BP and pain control.  No other recs at this time, patient is at high risk for post op complications.     The Patient requires high complexity decision making for assessment and support, frequent evaluation and titration of therapies.  Patient  satisfied with Plan of action and management. All questions answered  Lucie Leather, M.D.  [facility]  Medical Director [facility] Medical Director [facility] Cardio-Pulmonary Department

## 2015-10-11 NOTE — Progress Notes (Signed)
eMD new patient eval  S: looks stable on cam care  O: lying in bed. Looking around no distress  PULMONARY No results for input(s): PHART, PCO2ART, PO2ART, HCO3, TCO2, O2SAT in the last 168 hours.  Invalid input(s): PCO2, PO2  CBC  Recent Labs Lab 10/10/15 1701  HGB 15.7  HCT 44.2  WBC 10.4  PLT 146*    COAGULATION No results for input(s): INR in the last 168 hours.  CARDIAC   Recent Labs Lab 10/10/15 1701  TROPONINI <0.03   No results for input(s): PROBNP in the last 168 hours.   CHEMISTRY  Recent Labs Lab 10/10/15 1701  NA 136  K 3.8  CL 103  CO2 24  GLUCOSE 115*  BUN 14  CREATININE 0.67  CALCIUM 9.5   Estimated Creatinine Clearance: 114.7 mL/min (by C-G formula based on SCr of 0.8 mg/dL).   LIVER  Recent Labs Lab 10/10/15 1701  AST 79*  ALT 80*  ALKPHOS 72  BILITOT 0.6  PROT 7.3  ALBUMIN 3.9     INFECTIOUS No results for input(s): LATICACIDVEN, PROCALCITON in the last 168 hours.   ENDOCRINE CBG (last 3)   Recent Labs  10/11/15 0030  GLUCAP 125*       Dg Chest Portable 1 View  Result Date: 10/10/2015 CLINICAL DATA:  65 year old Wilson with history of chest pain since 1 p.m. today, with radiation to the right side. Emesis. No associated shortness of breath. Hypertension. EXAM: PORTABLE CHEST 1 VIEW COMPARISON:  Chest x-ray 09/02/2007. FINDINGS: Multiple tiny calcified granulomas are noted, most evident in the left lower lobe. Calcified left hilar lymph nodes. Lung volumes are normal. No consolidative airspace disease. No pleural effusions. No pneumothorax. No pulmonary nodule or mass noted. Pulmonary vasculature and the cardiomediastinal silhouette are within normal limits. IMPRESSION: No radiographic evidence of acute cardiopulmonary disease. Electronically Signed   By: Trudie Reed M.D.   On: 10/10/2015 17:43   Ct Angio Chest/abd/pel For Dissection W And/or Wo Contrast  Result Date: 10/10/2015 CLINICAL DATA:  Acute onset of  epigastric pain today. Clinical concern for aortic dissection. EXAM: CT ANGIOGRAPHY CHEST, ABDOMEN AND PELVIS TECHNIQUE: Multidetector CT imaging through the chest, abdomen and pelvis was performed using the standard protocol during bolus administration of intravenous contrast. Multiplanar reconstructed images and MIPs were obtained and reviewed to evaluate the vascular anatomy. CONTRAST:  100 cc Isovue 370 IV COMPARISON:  No prior comparison exams. FINDINGS: CTA CHEST FINDINGS Vascular: Examination is positive for acute aortic syndrome with intramural hematoma arising just distal a takeoff of the great vessels extending to the level of the diaphragmatic hiatus. The intramural hematoma is hyperdense on noncontrast imaging. Small focus of extraluminal contrast is seen within the is intramural hematoma in the distal thoracic aorta image 75 series 2 vein may be a penetrating ulcer. There is subsequent aneurysmal dilatation of the descending thoracic aorta measuring up to 4 cm. The aorta at the level of the diaphragmatic hiatus measures 3.6 x 3.9 cm. No periaortic soft tissue stranding. There is a conventional branching pattern from the aortic arch. There is atherosclerotic ossified noncalcified plaque at the origins of the great vessels, without hematoma involvement. There is diffuse calcified and noncalcified atheromatous plaque. No central pulmonary embolus. There are coronary artery calcifications. Mediastinum: No enlarged mediastinal or hilar lymph nodes. No pericardial fluid. Lungs/pleura: Biapical pleural-parenchymal scarring. Mild emphysema. No confluent airspace disease. No evidence pulmonary edema. Trachea and mainstem bronchi are patent. There is no pleural effusion or pleural fluid.  Musculoskeletal: There are no acute or suspicious osseous abnormalities. Multilevel degenerative change throughout the spine with multiple Schmorl's nodes. Review of the MIP images confirms the above findings. CTA ABDOMEN AND  PELVIS FINDINGS Vascular: Fusiform infrarenal abdominal aortic aneurysm, proximal aspect less than 1 cm from the takeoff of the left renal artery. Maximal dimension 5.0 x 4.7 cm. Distal extent just proximal to the iliac bifurcation. There is peripherally eccentric thrombus about the right lateral aspect with a thin band of thrombus extending into the opacified lumen versus limited short-segment dissection. There is no periaortic soft tissue stranding. 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 Normal caliber left common iliac artery. The celiac, superior mesenteric, and inferior mesenteric arteries are patent. There is an accessory artery arising from the most abdominal aorta supplying the lesser curvature of the stomach. Single bilateral renal arteries are patent. There is diffuse calcified and noncalcified atheromatous plaque. Hepatobiliary: The liver is prominent size measuring 21 cm cranial caudal. No focal abnormality detected on arterial phase imaging. Gallbladder physiologically distended. No calcified stone. Pancreas:  No ductal dilatation or inflammation. Spleen:  Normal in size.  Normal arterial phase imaging appearance. Adrenals:  No nodule. Kidneys: Symmetric enhancement. No hydronephrosis. Cyst in the upper left kidney. Stomach/bowel: Distal esophageal wall thickening with small hiatal hernia. Stomach physiologically distended. Suboptimal bowel assessment given lack of enteric contrast. No bowel dilatation. Moderate stool burden throughout the colon. Sigmoid colonic diverticulosis without diverticulitis. Appendix is normal. Lymphatics: No evidence of retroperitoneal, mesenteric or pelvic adenopathy. Bladder: Distended. Reproductive: Central prostatic calcifications. No acute abnormality. Other:  No free air free fluid.  No intra-abdominal abscess. Musculoskeletal: There are no acute or suspicious osseous abnormalities. Degenerative change throughout the lumbar spine, with  endplate changes at L5-S1. Review of the MIP images confirms the above findings. IMPRESSION: 1. Positive for acute aortic syndrome in the chest with aortic hematoma originating just distal to the great vessels. 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 No periaortic soft tissue stranding. 2. Infrarenal abdominal aorta measuring 5.0 x 4.7 cm. Eccentric mural thrombus with thin band of thrombus versus short-segment dissection in the infrarenal portion. No evidence of aortic rupture. Recommend followup by abdomen and pelvis CTA in 3-6 months, and vascular surgery referral/consultation if not already obtained. This recommendation follows ACR consensus guidelines: White Paper of the ACR Incidental Findings Committee II on Vascular Findings. J Am Coll Radiol 2013; 10:789-794. 3. 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 4. Incidental chest findings of emphysema and coronary artery calcifications. 5. Incidental abdomen/pelvis findings of sigmoid colonic diverticulosis without evidence of diverticulitis. Critical Value/emergent preliminary results were discussed by telephone at the time of interpretation on 10/10/2015 at 7:20 pm with Dr. Toney RakesERYKA GAYLE , who verbally acknowledged these results. Electronically Signed   By: Rubye OaksMelanie  Ehinger M.D.   On: 10/10/2015 19:46   [place] Abdomen Limited Ruq  Result Date: 10/10/2015 CLINICAL DATA:  Right upper quadrant pain for several hours, initial encounter EXAM: US ABDOMEN LIMITED - RIGHT UPPER QUADRANT COMPARISON:  None. FINDINGS: Gallbladder: Gallbladder is well distended with evidence of gallbladder sludge. Some nondependent echogenicities are noted along the gallbladder wall consistent with gallbladder polyps. Tiny gallstones are noted as well. No significant pericholecystic fluid is noted. No wall thickening is seen. Common bile duct: Diameter: 3.3 mm. Liver: No focal lesion identified.  Within normal limits in parenchymal echogenicity. Incidental note is made of dilatation of  the mid infrarenal aorta. The maximum dimension is 5.3 cm. Echogenic line is noted within the aorta with color flow on both sides suggestive of dissection. IMPRESSION: Small gallstones, gallbladder polyps in gallbladder sludge. No sonographic Eulah Pont sign is noted. Changes suggestive of aortic aneurysm with possible dissection. A CT is already scheduled for further evaluation of this abnormality. Electronically Signed   By: Alcide Clever M.D.   On: 10/10/2015 18:55   A   ICD-9-CM ICD-10-CM   1. Epigastric pain 789.06 R10.13   2. Pain 780.96 R52 US Abdomen Limited RUQ     US Abdomen Limited RUQ  3. Chest pain, unspecified chest pain type 786.50 R07.9   4. 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 Intramural aortic hematoma (HCC) 441.00 I71.00   6. Essential hypertension 401.9 I10     P Nil acoute from eMD Mgmt plan per bedside MD   Dr. Kalman Shan, M.D., [facility].C.P Pulmonary and Critical Care Medicine Staff Physician [facility] Pager: [phone], If no answer or between  15:00h - 7:00h: call [phone]  10/11/2015 12:54 AM

## 2015-10-12 ENCOUNTER — Inpatient Hospital Stay: Payer: Non-veteran care

## 2015-10-12 DIAGNOSIS — I10 Essential (primary) hypertension: Secondary | ICD-10-CM

## 2015-10-12 DIAGNOSIS — Z720 Tobacco use: Secondary | ICD-10-CM | POA: Diagnosis present

## 2015-10-12 DIAGNOSIS — I719 Aortic aneurysm of unspecified site, without rupture: Secondary | ICD-10-CM | POA: Diagnosis present

## 2015-10-12 DIAGNOSIS — R071 Chest pain on breathing: Secondary | ICD-10-CM

## 2015-10-12 DIAGNOSIS — F17200 Nicotine dependence, unspecified, uncomplicated: Secondary | ICD-10-CM | POA: Diagnosis present

## 2015-10-12 DIAGNOSIS — I7100 Dissection of unspecified site of aorta: Secondary | ICD-10-CM

## 2015-10-12 DIAGNOSIS — I700 Atherosclerosis of aorta: Secondary | ICD-10-CM

## 2015-10-12 LAB — LIPASE, BLOOD: Lipase: 22 U/L (ref 11–51)

## 2015-10-12 LAB — BASIC METABOLIC PANEL
ANION GAP: 8 (ref 5–15)
Anion gap: 8 (ref 5–15)
BUN: 16 mg/dL (ref 6–20)
BUN: 16 mg/dL (ref 6–20)
CALCIUM: 8.7 mg/dL — AB (ref 8.9–10.3)
CHLORIDE: 101 mmol/L (ref 101–111)
CHLORIDE: 102 mmol/L (ref 101–111)
CO2: 26 mmol/L (ref 22–32)
CO2: 26 mmol/L (ref 22–32)
CREATININE: 0.57 mg/dL — AB (ref 0.61–1.24)
CREATININE: 0.59 mg/dL — AB (ref 0.61–1.24)
Calcium: 8.4 mg/dL — ABNORMAL LOW (ref 8.9–10.3)
GFR calc Af Amer: >60 mL/min (ref >60)
GFR calc non Af Amer: >60 mL/min (ref >60)
GFR calc non Af Amer: >60 mL/min (ref >60)
GLUCOSE: 131 mg/dL — AB (ref 65–99)
Glucose, Bld: 142 mg/dL — ABNORMAL HIGH (ref 65–99)
POTASSIUM: 3.6 mmol/L (ref 3.5–5.1)
Potassium: 3.6 mmol/L (ref 3.5–5.1)
SODIUM: 136 mmol/L (ref 135–145)
Sodium: 135 mmol/L (ref 135–145)

## 2015-10-12 LAB — CBC
HEMATOCRIT: 44 % (ref 40.0–52.0)
HEMOGLOBIN: 15.5 g/dL (ref 13.0–18.0)
MCH: 30.7 pg (ref 26.0–34.0)
MCHC: 35.2 g/dL (ref 32.0–36.0)
MCV: 87.2 fL (ref 80.0–100.0)
Platelets: 139 10*3/uL — ABNORMAL LOW (ref 150–440)
RBC: 5.05 MIL/uL (ref 4.40–5.90)
RDW: 13.4 % (ref 11.5–14.5)
WBC: 16.6 10*3/uL — ABNORMAL HIGH (ref 3.8–10.6)

## 2015-10-12 LAB — LACTIC ACID, PLASMA
Lactic Acid, Venous: 1.1 mmol/L (ref 0.5–1.9)
Lactic Acid, Venous: 1 mmol/L (ref 0.5–1.9)

## 2015-10-12 LAB — BLOOD GAS, ARTERIAL
ALLENS TEST (PASS/FAIL): POSITIVE — AB
Acid-Base Excess: 3.5 mmol/L — ABNORMAL HIGH (ref 0.0–3.0)
BICARBONATE: 27.8 meq/L (ref 21.0–28.0)
FIO2: 0.32
O2 Saturation: 93.4 %
PATIENT TEMPERATURE: 37
PH ART: 7.45 (ref 7.350–7.450)
pCO2 arterial: 40 mmHg (ref 32.0–48.0)
pO2, Arterial: 65 mmHg — ABNORMAL LOW (ref 83.0–108.0)

## 2015-10-12 LAB — MAGNESIUM: Magnesium: 1.7 mg/dL (ref 1.7–2.4)

## 2015-10-12 LAB — HEPATIC FUNCTION PANEL
ALBUMIN: 3.8 g/dL (ref 3.5–5.0)
ALK PHOS: 69 U/L (ref 38–126)
ALT: 66 U/L — AB (ref 17–63)
AST: 57 U/L — AB (ref 15–41)
BILIRUBIN DIRECT: 0.6 mg/dL — AB (ref 0.1–0.5)
BILIRUBIN TOTAL: 1.6 mg/dL — AB (ref 0.3–1.2)
Indirect Bilirubin: 1 mg/dL — ABNORMAL HIGH (ref 0.3–0.9)
Total Protein: 7.8 g/dL (ref 6.5–8.1)

## 2015-10-12 LAB — TROPONIN I: Troponin I: <0.03 ng/mL (ref <0.03)

## 2015-10-12 MED ORDER — IPRATROPIUM-ALBUTEROL 0.5-2.5 (3) MG/3ML IN SOLN
3.0000 mL | RESPIRATORY_TRACT | Status: DC
  Administered 2015-10-12 – 2015-10-13 (×5): 3 mL via RESPIRATORY_TRACT
  Filled 2015-10-12 (×8): qty 3

## 2015-10-12 MED ORDER — TIOTROPIUM BROMIDE MONOHYDRATE 18 MCG IN CAPS
18.0000 ug | ORAL_CAPSULE | Freq: Every day | RESPIRATORY_TRACT | Status: DC
  Administered 2015-10-12 – 2015-10-13 (×2): 18 ug via RESPIRATORY_TRACT
  Filled 2015-10-12: qty 5

## 2015-10-12 MED ORDER — MOMETASONE FURO-FORMOTEROL FUM 100-5 MCG/ACT IN AERO
2.0000 | INHALATION_SPRAY | Freq: Two times a day (BID) | RESPIRATORY_TRACT | Status: DC
  Administered 2015-10-12 – 2015-10-13 (×3): 2 via RESPIRATORY_TRACT
  Filled 2015-10-12: qty 8.8

## 2015-10-12 MED ORDER — NICOTINE 21 MG/24HR TD PT24
21.0000 mg | MEDICATED_PATCH | Freq: Every day | TRANSDERMAL | Status: DC
  Administered 2015-10-12 – 2015-10-13 (×2): 21 mg via TRANSDERMAL
  Filled 2015-10-12 (×2): qty 1

## 2015-10-12 MED ORDER — ZOLPIDEM TARTRATE 5 MG PO TABS
5.0000 mg | ORAL_TABLET | Freq: Every evening | ORAL | Status: DC | PRN
Start: 2015-10-12 — End: 2015-10-13
  Administered 2015-10-12: 5 mg via ORAL
  Filled 2015-10-12: qty 1

## 2015-10-12 MED ORDER — HYDRALAZINE HCL 50 MG PO TABS
50.0000 mg | ORAL_TABLET | Freq: Three times a day (TID) | ORAL | Status: DC
  Administered 2015-10-12 – 2015-10-13 (×3): 50 mg via ORAL
  Filled 2015-10-12 (×3): qty 1

## 2015-10-12 MED ORDER — LABETALOL HCL 5 MG/ML IV SOLN
20.0000 mg | INTRAVENOUS | Status: DC | PRN
  Administered 2015-10-12: 20 mg via INTRAVENOUS
  Filled 2015-10-12: qty 4

## 2015-10-12 NOTE — Progress Notes (Signed)
Rounded with MD Hower in patients room and notified MD of patients low urine output this shift. No new orders at this time, will continue to monitor patient.

## 2015-10-12 NOTE — Progress Notes (Signed)
Pt BP taken in all four extremities  Per MD order. RUE 159/86 LUE  143/71 RLE 128/65  LLE  132/53   Will call results to MD Will continue to assess.

## 2015-10-12 NOTE — Progress Notes (Signed)
Pt currently in stable condition. BP currently 110/69. Pt is asleep. Pain controlled with Dilaudid x 3. Pt has had adequate output during my shift.  Report given to oncoming RN.

## 2015-10-12 NOTE — Progress Notes (Signed)
[facility] [place] [facility]   PATIENT NAME: Cory DingwallRobert Wilson    MRN#:  [id number]  DATE OF BIRTH:  06/20/1950  SUBJECTIVE:  Hospital Day: 2 days Cory DingwallRobert Wilson is a 65 y.o. male presenting with Chest Pain and Abdominal Pain .   Overnight events: no acute overnight events Interval Events: continued chest pain, though somewhat improved  REVIEW OF SYSTEMS:  CONSTITUTIONAL: No fever, fatigue or weakness.  EYES: No blurred or double vision.  EARS, NOSE, AND THROAT: No tinnitus or ear pain.  RESPIRATORY: No cough, shortness of breath, wheezing or hemoptysis.  CARDIOVASCULAR: positive chest pain, denies orthopnea, edema.  GASTROINTESTINAL: No nausea, vomiting, diarrhea or abdominal pain.  GENITOURINARY: No dysuria, hematuria.  ENDOCRINE: No polyuria, nocturia,  HEMATOLOGY: No anemia, easy bruising or bleeding SKIN: No rash or lesion. MUSCULOSKELETAL: No joint pain or arthritis.   NEUROLOGIC: No tingling, numbness, weakness.  PSYCHIATRY: positive anxiety denies depression.   DRUG ALLERGIES:  No Known Allergies  VITALS:  Blood pressure 130/69, pulse 85, temperature 100 F (37.8 C), temperature source Oral, resp. rate (!) 22, height 6\' 1"  (1.854 m), weight 88 kg (194 lb 0.1 oz), SpO2 95 %.  PHYSICAL EXAMINATION:  VITAL SIGNS: Vitals:   10/12/15 1500 10/12/15 1515  BP: 115/67 130/69  Pulse: 86 85  Resp: 20 (!) 22  Temp:     GENERAL:65 y.o.male currently in no acute distress.  HEAD: Normocephalic, atraumatic.  EYES: Pupils equal, round, reactive to light. Extraocular muscles intact. No scleral icterus.  MOUTH: Moist mucosal membrane. Dentition intact. No abscess noted.  EAR, NOSE, THROAT: Clear without exudates. No external lesions.  NECK: Supple. No thyromegaly. No nodules. No JVD.  PULMONARY: Clear to ascultation, without wheeze rails or rhonci. No use of accessory muscles, Good respiratory effort. good air entry bilaterally CHEST: Nontender to  palpation.  CARDIOVASCULAR: S1 and S2. Regular rate and rhythm. No murmurs, rubs, or gallops. No edema. Pedal pulses 2+ bilaterally.  GASTROINTESTINAL: Soft, nontender, nondistended. No masses. Positive bowel sounds. No hepatosplenomegaly.  MUSCULOSKELETAL: No swelling, clubbing, or edema. Range of motion full in all extremities.  NEUROLOGIC: Cranial nerves II through XII are intact. No gross focal neurological deficits. Sensation intact. Reflexes intact.  SKIN: No ulceration, lesions, rashes, or cyanosis. Skin warm and dry. Turgor intact.  PSYCHIATRIC: Mood, affect within normal limits. The patient is awake, alert and oriented x 3. Insight, judgment intact.      LABORATORY PANEL:   CBC  Recent Labs Lab 10/12/15 0110  WBC 16.6*  HGB 15.5  HCT 44.0  PLT 139*   ------------------------------------------------------------------------------------------------------------------  Chemistries   Recent Labs Lab 10/12/15 0110 10/12/15 0346  NA 135 136  K 3.6 3.6  CL 101 102  CO2 26 26  GLUCOSE 131* 142*  BUN 16 16  CREATININE 0.57* 0.59*  CALCIUM 8.7* 8.4*  MG  --  1.7  AST 57*  --   ALT 66*  --   ALKPHOS 69  --   BILITOT 1.6*  --    ------------------------------------------------------------------------------------------------------------------  Cardiac Enzymes  Recent Labs Lab 10/12/15 0346  TROPONINI <0.03   ------------------------------------------------------------------------------------------------------------------  RADIOLOGY:  Dg Chest Port 1 View  Result Date: 10/12/2015 CLINICAL DATA:  Hypoxemia EXAM: PORTABLE CHEST 1 VIEW COMPARISON:  10/10/2015 FINDINGS: Increase in bibasilar atelectasis compared to the prior study. Decreased lung volume. Negative for edema or effusion. Heart size within normal limits. IMPRESSION: Interval increase in hypoventilation with bibasilar atelectasis. Electronically Signed   By: Marlan Palauharles  Clark  M.D.   On: 10/12/2015 07:58   Dg  Chest Portable 1 View  Result Date: 10/10/2015 CLINICAL DATA:  65 year old male with history of chest pain since 1 p.m. today, with radiation to the right side. Emesis. No associated shortness of breath. Hypertension. EXAM: PORTABLE CHEST 1 VIEW COMPARISON:  Chest x-ray 09/02/2007. FINDINGS: Multiple tiny calcified granulomas are noted, most evident in the left lower lobe. Calcified left hilar lymph nodes. Lung volumes are normal. No consolidative airspace disease. No pleural effusions. No pneumothorax. No pulmonary nodule or mass noted. Pulmonary vasculature and the cardiomediastinal silhouette are within normal limits. IMPRESSION: No radiographic evidence of acute cardiopulmonary disease. Electronically Signed   By: Trudie Reed M.D.   On: 10/10/2015 17:43   Ct Angio Chest/abd/pel For Dissection W And/or Wo Contrast  Result Date: 10/10/2015 CLINICAL DATA:  Acute onset of epigastric pain today. Clinical concern for aortic dissection. EXAM: CT ANGIOGRAPHY CHEST, ABDOMEN AND PELVIS TECHNIQUE: Multidetector CT imaging through the chest, abdomen and pelvis was performed using the standard protocol during bolus administration of intravenous contrast. Multiplanar reconstructed images and MIPs were obtained and reviewed to evaluate the vascular anatomy. CONTRAST:  100 cc Isovue 370 IV COMPARISON:  No prior comparison exams. FINDINGS: CTA CHEST FINDINGS Vascular: Examination is positive for acute aortic syndrome with intramural hematoma arising just distal a takeoff of the great vessels extending to the level of the diaphragmatic hiatus. The intramural hematoma is hyperdense on noncontrast imaging. Small focus of extraluminal contrast is seen within the is intramural hematoma in the distal thoracic aorta image 75 series 2 vein may be a penetrating ulcer. There is subsequent aneurysmal dilatation of the descending thoracic aorta measuring up to 4 cm. The aorta at the level of the diaphragmatic hiatus measures 3.6 x  3.9 cm. No periaortic soft tissue stranding. There is a conventional branching pattern from the aortic arch. There is atherosclerotic ossified noncalcified plaque at the origins of the great vessels, without hematoma involvement. There is diffuse calcified and noncalcified atheromatous plaque. No central pulmonary embolus. There are coronary artery calcifications. Mediastinum: No enlarged mediastinal or hilar lymph nodes. No pericardial fluid. Lungs/pleura: Biapical pleural-parenchymal scarring. Mild emphysema. No confluent airspace disease. No evidence pulmonary edema. Trachea and mainstem bronchi are patent. There is no pleural effusion or pleural fluid. Musculoskeletal: There are no acute or suspicious osseous abnormalities. Multilevel degenerative change throughout the spine with multiple Schmorl's nodes. Review of the MIP images confirms the above findings. CTA ABDOMEN AND PELVIS FINDINGS Vascular: Fusiform infrarenal abdominal aortic aneurysm, proximal aspect less than 1 cm from the takeoff of the left renal artery. Maximal dimension 5.0 x 4.7 cm. Distal extent just proximal to the iliac bifurcation. There is peripherally eccentric thrombus about the right lateral aspect with a thin band of thrombus extending into the opacified lumen versus limited short-segment dissection. There is no periaortic soft tissue stranding. 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 Normal caliber left common iliac artery. The celiac, superior mesenteric, and inferior mesenteric arteries are patent. There is an accessory artery arising from the most abdominal aorta supplying the lesser curvature of the stomach. Single bilateral renal arteries are patent. There is diffuse calcified and noncalcified atheromatous plaque. Hepatobiliary: The liver is prominent size measuring 21 cm cranial caudal. No focal abnormality detected on arterial phase imaging. Gallbladder physiologically distended. No calcified  stone. Pancreas:  No ductal dilatation or inflammation. Spleen:  Normal in size.  Normal arterial phase imaging appearance. Adrenals:  No nodule.  Kidneys: Symmetric enhancement. No hydronephrosis. Cyst in the upper left kidney. Stomach/bowel: Distal esophageal wall thickening with small hiatal hernia. Stomach physiologically distended. Suboptimal bowel assessment given lack of enteric contrast. No bowel dilatation. Moderate stool burden throughout the colon. Sigmoid colonic diverticulosis without diverticulitis. Appendix is normal. Lymphatics: No evidence of retroperitoneal, mesenteric or pelvic adenopathy. Bladder: Distended. Reproductive: Central prostatic calcifications. No acute abnormality. Other:  No free air free fluid.  No intra-abdominal abscess. Musculoskeletal: There are no acute or suspicious osseous abnormalities. Degenerative change throughout the lumbar spine, with endplate changes at L5-S1. Review of the MIP images confirms the above findings. IMPRESSION: 1. Positive for acute aortic syndrome in the chest with aortic hematoma originating just distal to the great vessels. 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 No periaortic soft tissue stranding. 2. Infrarenal abdominal aorta measuring 5.0 x 4.7 cm. Eccentric mural thrombus with thin band of thrombus versus short-segment dissection in the infrarenal portion. No evidence of aortic rupture. Recommend followup by abdomen and pelvis CTA in 3-6 months, and vascular surgery referral/consultation if not already obtained. This recommendation follows ACR consensus guidelines: White Paper of the ACR Incidental Findings Committee II on Vascular Findings. J Am Coll Radiol 2013; 10:789-794. 3. 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 4. Incidental chest findings of emphysema and coronary artery calcifications. 5. Incidental abdomen/pelvis findings of sigmoid  colonic diverticulosis without evidence of diverticulitis. Critical Value/emergent preliminary results were discussed by telephone at the time of interpretation on 10/10/2015 at 7:20 pm with Dr. Toney Rakes , who verbally acknowledged these results. Electronically Signed   By: Rubye Oaks M.D.   On: 10/10/2015 19:46   US Abdomen Limited Ruq  Result Date: 10/10/2015 CLINICAL DATA:  Right upper quadrant pain for several hours, initial encounter EXAM: US ABDOMEN LIMITED - RIGHT UPPER QUADRANT COMPARISON:  None. FINDINGS: Gallbladder: Gallbladder is well distended with evidence of gallbladder sludge. Some nondependent echogenicities are noted along the gallbladder wall consistent with gallbladder polyps. Tiny gallstones are noted as well. No significant pericholecystic fluid is noted. No wall thickening is seen. Common bile duct: Diameter: 3.3 mm. Liver: No focal lesion identified. Within normal limits in parenchymal echogenicity. Incidental note is made of dilatation of the mid infrarenal aorta. The maximum dimension is 5.3 cm. Echogenic line is noted within the aorta with color flow on both sides suggestive of dissection. IMPRESSION: Small gallstones, gallbladder polyps in gallbladder sludge. No sonographic Eulah Pont sign is noted. Changes suggestive of aortic aneurysm with possible dissection. A CT is already scheduled for further evaluation of this abnormality. Electronically Signed   By: Alcide Clever M.D.   On: 10/10/2015 18:55    EKG:   Orders placed or performed during the hospital encounter of 10/10/15  . ED EKG within 10 minutes  . ED EKG within 10 minutes  . EKG 12-Lead  . EKG 12-Lead  . EKG 12-Lead  . EKG 12-Lead    ASSESSMENT AND PLAN:   Ladavion Savitz is a 65 y.o. male presenting with Chest Pain and Abdominal Pain . Admitted 10/10/2015 : Day #: 2 days   1. acute aortic syndromewith aortic hematoma originating just distal to the great vessels. Small penetrating ulcer in the distal  descending thoracic aorta with contrast extending into the intramural hematoma   appreciate vascular surgery input, bp control, pain control -add bowel regiment Goal for symptom control, bp control, discharge with vascular follow up Is unable  to control symptoms/bp may benefit from icu-icu transfer with possible vascular intervention   2. Malignant hypertension: Continue aggressive treatment for blood pressure   All the records are reviewed and case discussed with Care Management/Social Workerr. Management plans discussed with the patient, family and they are in agreement.  CODE STATUS: full TOTAL TIME TAKING CARE OF THIS PATIENT: 33 minutes.   POSSIBLE D/C IN 2-3DAYS, DEPENDING ON CLINICAL CONDITION.   Etherine Mackowiak,  Mardi Mainland.D on 10/12/2015 at 3:42 PM  Between 7am to 6pm - Pager - [phone]  After 6pm: House Pager: - [phone]  [facility]  Office  [phone]  CC: Primary care physician; No primary care provider on file.

## 2015-10-12 NOTE — Care Management (Signed)
Message received from Michaelene SongLaurie V. With [facility]. Patient is NOT registered at any [facility], but is a [employer]. He needs to register and obtain a PCP with them if [place] wants to utilize [facility]. He is non-service connected so he doesn't meet criteria for contracted SNF or LTC.  Transfer declined by [facility] on 10/10/15 due to the [facility] not being able to provide the necessary care for his presenting (vascular) condition. He is uninsured at this time; no VA benefits.  Any VA ED would establish or at least start care with patient if he presents to them (or transfers to them).

## 2015-10-12 NOTE — Progress Notes (Signed)
Rounded with cardiology in room with patient and clarified weather to increase drips or to administer PRN IV push medications to have BP meet parameters of SBP between 100-120. Will continue to monitor patient.

## 2015-10-12 NOTE — Progress Notes (Signed)
Patient Name: Cory Wilson Date of Encounter: 10/12/2015  No care team member to display  PROBLEM LIST  Active Problems:   Intramural aortic hematoma (HCC)   Pain in the chest   Epigastric pain     SUBJECTIVE  Pt feels somewhat improved but still with epigastric pain 2/10, needing dilaudid for pain control.   CURRENT MEDS . antiseptic oral rinse  7 mL Mouth Rinse BID  . famotidine (PEPCID) IV  20 mg Intravenous Q12H  . hydrALAZINE  50 mg Oral Q8H  . ipratropium-albuterol  3 mL Nebulization Q4H  . losartan  50 mg Oral Daily  . mometasone-formoterol  2 puff Inhalation BID  . nicotine  21 mg Transdermal Daily  . pantoprazole  40 mg Oral Q1200  . pneumococcal 23 valent vaccine  0.5 mL Intramuscular Tomorrow-1000  . senna  1 tablet Oral Daily  . tiotropium  18 mcg Inhalation Daily    OBJECTIVE  Vitals:   10/12/15 1115 10/12/15 1130 10/12/15 1145 10/12/15 1200  BP: (!) 126/92 126/74 (!) 161/70 (!) 147/74  Pulse: 74 71 79 75  Resp: (!) 24 (!) 23 (!) 23 (!) 24  Temp:      TempSrc:      SpO2: 95% 95% 94% 91%  Weight:      Height:        Intake/Output Summary (Last 24 hours) at 10/12/15 1204 Last data filed at 10/12/15 1017  Gross per 24 hour  Intake          4262.93 ml  Output             1925 ml  Net          2337.93 ml   Filed Weights   10/11/15 0030 10/11/15 0207 10/12/15 0456  Weight: 193 lb 9 oz (87.8 kg) 193 lb 9 oz (87.8 kg) 194 lb 0.1 oz (88 kg)    PHYSICAL EXAM VS:  BP (!) 147/74   Pulse 75   Temp 97.6 F (36.4 C) (Axillary)   Resp (!) 24   Ht 6\' 1"  (1.854 m)   Wt 194 lb 0.1 oz (88 kg)   SpO2 91%   BMI 25.60 kg/m  , BMI Body mass index is 25.6 kg/m. GENERAL:  well developed, well nourished,, not in acute distress HEENT: normocephalic, pink conjunctivae, anicteric sclerae, no xanthelasma, normal dentition, oropharynx clear NECK:  no neck vein engorgement, JVP normal, no hepatojugular reflux, carotid upstroke brisk and symmetric, no bruit, no  thyromegaly, no lymphadenopathy LUNGS:  good respiratory effort, clear to auscultation bilaterally CV:  PMI not displaced, no thrills, no lifts, S1 and S2 within normal limits, no palpable S3 or S4, no murmurs, no rubs, no gallops ABD:  Soft, nontender, nondistended, normoactive bowel sounds, no abdominal aortic bruit, no hepatomegaly, no splenomegaly MS: nontender back, no kyphosis, no scoliosis, no joint deformities EXT:  1+ DP/PT pulses, no edema, no varicosities, no cyanosis, no clubbing SKIN: warm, nondiaphoretic, normal turgor, no ulcers NEUROPSYCH: alert, oriented to person, place, and time, sensory/motor grossly intact, normal mood, appropriate affect   Accessory Clinical Findings  CBC  Recent Labs  10/11/15 0424 10/12/15 0110  WBC 12.3* 16.6*  HGB 14.9 15.5  HCT 42.4 44.0  MCV 87.5 87.2  PLT 139* 139*   Basic Metabolic Panel  Recent Labs  10/10/15 1701 10/11/15 0424 10/12/15 0110 10/12/15 0346  NA 136 137 135 136  K 3.8 4.4 3.6 3.6  CL 103 102 101 102  CO2  GLUCOSE 115* 169* 131* 142*  BUN CREATININE 0.67 0.74 0.57* 0.59*  CALCIUM 9.5 9.0 8.7* 8.4*  MG 2.0 2.0  --  1.7  PHOS 2.9 2.9  --   --    Liver Function Tests  Recent Labs  10/10/15 1701 10/12/15 0110  AST 79* 57*  ALT 80* 66*  ALKPHOS 72 69  BILITOT 0.6 1.6*  PROT 7.3 7.8  ALBUMIN 3.9 3.8    Recent Labs  10/10/15 1701 10/12/15 0110  LIPASE 33 22   Cardiac Enzymes  Recent Labs  10/10/15 1701 10/11/15 0909 10/12/15 0346  TROPONINI <0.03 <0.03 <0.03   BNP (last 3 results) No results for input(s): BNP in the last 8760 hours. D-Dimer No results for input(s): DDIMER in the last 72 hours. Hemoglobin A1C No results for input(s): HGBA1C in the last 72 hours. Fasting Lipid Panel  Recent Labs  10/10/15 1701  CHOL 171  HDL 28*  LDLCALC 66  TRIG 409*  CHOLHDL 6.1   Thyroid Function Tests No results for input(s): TSH, T4TOTAL, T3FREE, THYROIDAB in the  last 72 hours.  Invalid input(s): FREET3  TELE  SR  RADIOLOGY/STUDIES  Dg Chest Port 1 View  Result Date: 10/12/2015 CLINICAL DATA:  Hypoxemia EXAM: PORTABLE CHEST 1 VIEW COMPARISON:  10/10/2015 FINDINGS: Increase in bibasilar atelectasis compared to the prior study. Decreased lung volume. Negative for edema or effusion. Heart size within normal limits. IMPRESSION: Interval increase in hypoventilation with bibasilar atelectasis. Electronically Signed   By: Marlan Palau M.D.   On: 10/12/2015 07:58   Dg Chest Portable 1 View  Result Date: 10/10/2015 CLINICAL DATA:  65 year old male with history of chest pain since 1 p.m. today, with radiation to the right side. Emesis. No associated shortness of breath. Hypertension. EXAM: PORTABLE CHEST 1 VIEW COMPARISON:  Chest x-ray 09/02/2007. FINDINGS: Multiple tiny calcified granulomas are noted, most evident in the left lower lobe. Calcified left hilar lymph nodes. Lung volumes are normal. No consolidative airspace disease. No pleural effusions. No pneumothorax. No pulmonary nodule or mass noted. Pulmonary vasculature and the cardiomediastinal silhouette are within normal limits. IMPRESSION: No radiographic evidence of acute cardiopulmonary disease. Electronically Signed   By: Trudie Reed M.D.   On: 10/10/2015 17:43   Ct Angio Chest/abd/pel For Dissection W And/or Wo Contrast  Result Date: 10/10/2015 CLINICAL DATA:  Acute onset of epigastric pain today. Clinical concern for aortic dissection. EXAM: CT ANGIOGRAPHY CHEST, ABDOMEN AND PELVIS TECHNIQUE: Multidetector CT imaging through the chest, abdomen and pelvis was performed using the standard protocol during bolus administration of intravenous contrast. Multiplanar reconstructed images and MIPs were obtained and reviewed to evaluate the vascular anatomy. CONTRAST:  100 cc Isovue 370 IV COMPARISON:  No prior comparison exams. FINDINGS: CTA CHEST FINDINGS Vascular: Examination is positive for acute aortic  syndrome with intramural hematoma arising just distal a takeoff of the great vessels extending to the level of the diaphragmatic hiatus. The intramural hematoma is hyperdense on noncontrast imaging. Small focus of extraluminal contrast is seen within the is intramural hematoma in the distal thoracic aorta image 75 series 2 vein may be a penetrating ulcer. There is subsequent aneurysmal dilatation of the descending thoracic aorta measuring up to 4 cm. The aorta at the level of the diaphragmatic hiatus measures 3.6 x 3.9 cm. No periaortic soft tissue stranding. There is a conventional branching pattern from the aortic arch. There is atherosclerotic ossified noncalcified plaque at the origins of  the great vessels, without hematoma involvement. There is diffuse calcified and noncalcified atheromatous plaque. No central pulmonary embolus. There are coronary artery calcifications. Mediastinum: No enlarged mediastinal or hilar lymph nodes. No pericardial fluid. Lungs/pleura: Biapical pleural-parenchymal scarring. Mild emphysema. No confluent airspace disease. No evidence pulmonary edema. Trachea and mainstem bronchi are patent. There is no pleural effusion or pleural fluid. Musculoskeletal: There are no acute or suspicious osseous abnormalities. Multilevel degenerative change throughout the spine with multiple Schmorl's nodes. Review of the MIP images confirms the above findings. CTA ABDOMEN AND PELVIS FINDINGS Vascular: Fusiform infrarenal abdominal aortic aneurysm, proximal aspect less than 1 cm from the takeoff of the left renal artery. Maximal dimension 5.0 x 4.7 cm. Distal extent just proximal to the iliac bifurcation. There is peripherally eccentric thrombus about the right lateral aspect with a thin band of thrombus extending into the opacified lumen versus limited short-segment dissection. There is no periaortic soft tissue stranding. 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 Normal  caliber left common iliac artery. The celiac, superior mesenteric, and inferior mesenteric arteries are patent. There is an accessory artery arising from the most abdominal aorta supplying the lesser curvature of the stomach. Single bilateral renal arteries are patent. There is diffuse calcified and noncalcified atheromatous plaque. Hepatobiliary: The liver is prominent size measuring 21 cm cranial caudal. No focal abnormality detected on arterial phase imaging. Gallbladder physiologically distended. No calcified stone. Pancreas:  No ductal dilatation or inflammation. Spleen:  Normal in size.  Normal arterial phase imaging appearance. Adrenals:  No nodule. Kidneys: Symmetric enhancement. No hydronephrosis. Cyst in the upper left kidney. Stomach/bowel: Distal esophageal wall thickening with small hiatal hernia. Stomach physiologically distended. Suboptimal bowel assessment given lack of enteric contrast. No bowel dilatation. Moderate stool burden throughout the colon. Sigmoid colonic diverticulosis without diverticulitis. Appendix is normal. Lymphatics: No evidence of retroperitoneal, mesenteric or pelvic adenopathy. Bladder: Distended. Reproductive: Central prostatic calcifications. No acute abnormality. Other:  No free air free fluid.  No intra-abdominal abscess. Musculoskeletal: There are no acute or suspicious osseous abnormalities. Degenerative change throughout the lumbar spine, with endplate changes at L5-S1. Review of the MIP images confirms the above findings. IMPRESSION: 1. Positive for acute aortic syndrome in the chest with aortic hematoma originating just distal to the great vessels. 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 No periaortic soft tissue stranding. 2. Infrarenal abdominal aorta measuring 5.0 x 4.7 cm. Eccentric mural thrombus with thin band of thrombus versus short-segment dissection in the infrarenal portion. No  evidence of aortic rupture. Recommend followup by abdomen and pelvis CTA in 3-6 months, and vascular surgery referral/consultation if not already obtained. This recommendation follows ACR consensus guidelines: White Paper of the ACR Incidental Findings Committee II on Vascular Findings. J Am Coll Radiol 2013; 10:789-794. 3. 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 4. Incidental chest findings of emphysema and coronary artery calcifications. 5. Incidental abdomen/pelvis findings of sigmoid colonic diverticulosis without evidence of diverticulitis. Critical Value/emergent preliminary results were discussed by telephone at the time of interpretation on 10/10/2015 at 7:20 pm with Dr. Toney RakesERYKA GAYLE , who verbally acknowledged these results. Electronically Signed   By: Rubye OaksMelanie  Ehinger M.D.   On: 10/10/2015 19:46   [place] Abdomen Limited Ruq  Result Date: 10/10/2015 CLINICAL DATA:  Right upper quadrant pain for several hours, initial encounter EXAM: US ABDOMEN LIMITED - RIGHT UPPER QUADRANT COMPARISON:  None. FINDINGS: Gallbladder: Gallbladder is  well distended with evidence of gallbladder sludge. Some nondependent echogenicities are noted along the gallbladder wall consistent with gallbladder polyps. Tiny gallstones are noted as well. No significant pericholecystic fluid is noted. No wall thickening is seen. Common bile duct: Diameter: 3.3 mm. Liver: No focal lesion identified. Within normal limits in parenchymal echogenicity. Incidental note is made of dilatation of the mid infrarenal aorta. The maximum dimension is 5.3 cm. Echogenic line is noted within the aorta with color flow on both sides suggestive of dissection. IMPRESSION: Small gallstones, gallbladder polyps in gallbladder sludge. No sonographic Eulah Pont sign is noted. Changes suggestive of aortic aneurysm with possible dissection. A CT is already scheduled for further evaluation of this abnormality. Electronically Signed   By: Alcide Clever  M.D.   On: 10/10/2015 18:55   Echo 10/11/2015: Left ventricle: The cavity size was normal. There was moderate   concentric hypertrophy. Systolic function was normal. The   estimated ejection fraction was in the range of 60% to 65%. Wall   motion was normal; there were no regional wall motion   abnormalities. Doppler parameters are consistent with abnormal   left ventricular relaxation (grade 1 diastolic dysfunction).   ASSESSMENT AND PLAN HTN, in setting of intramural aortic hematoma with penetrating ulcer Continue with aggressive blood pressure control with nicardipine drip and nitroprusside drip May utilize labetalol with holding parameters. Issues with bradycardia yesterday, necessitating holding off beta blocker. Management of the intramural hematoma per vascular surgery. Referral to tertiary care center per vascular surgery note yesterday. LVEF within normal limits on echocardiogram.  Tobacco use Smoking cessation recommended  Total encounter time is  Signed, Almond Lint, MD  10/12/2015, 12:04 PM  [facility]

## 2015-10-12 NOTE — Progress Notes (Signed)
eLink Physician-Brief Progress Note Patient Name: Cory BlanksRobert D Wilson DOB: 04/20/1950 MRN: [id number]    1. Epi pain - improved after gi cocktail. LE bp equalon both sides  2. Aortic thrombus - LUE BP lower than Right - BP still hard to control despite 2 gtt. Advsed RN to give labatelol prn  3. Hypoxemia 89% on 2 L [place]. PE ruled out admit 10/10/2015. Has emphysema on CT. Will Rx with duoneb. Get CXR. Get ABG. Will consider lasix  4. Overall - bedside MD to decide on transfer     Intervention Category Major Interventions: Other:  [name] 10/12/2015, 2:30 AM

## 2015-10-12 NOTE — H&P (Signed)
---------------------------------------------------------------------------------------------------------------------   PATIENT NAME: Cory Wilson MR#: [id number] DATE OF BIRTH: [birth year] DATE OF ADMISSION: 10/10/2015 PRIMARY CARE PHYSICIAN: No primary care provider on file.  REQUESTING/REFERRING PHYSICIAN: ED Dr. Darnelle Catalan  CHIEF COMPLAINT: Chief Complaint  Patient presents with  . Chest Pain  . Abdominal Pain    HISTORY OF PRESENT ILLNESS: Cory Wilson is a 65 y.o. male with history of hepatitis C started having 10/10 "pulsating" chest pain in the lower mid-sternum association with nausea, dry heaves, shortness of breath and diaphoresis. -  Pain was relieved after EMS placed nitropaste.  - He states that he has had "heartburn" in the same region intermittently over the past few months which seemed to have been relieved by Rolaids.   -He has not had a physical exam in the past 7 years, and has never had a cardiac workup.    Otherwise there has been no change in status. There has been no recent change in medication or diet.  There has been no recent illness, travel or sick contacts.    Patient denies fevers/chills, weakness, dizziness, C/D, dysuria/frequency, changes in mental status.   EMS/ED COURSE:  Patient rec'd nitropaste in the ambulance was started on a nitroprusside drip in the ED for elevated BP  CTA  Findings as reviewed below  1. Positive for acute aortic syndrome in the chest with aortic hematoma originating just distal to the great vessels. 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 No periaortic soft tissue stranding. 2. Infrarenal abdominal aorta measuring 5.0 x 4.7 cm. Eccentric mural thrombus with thin band of thrombus versus short-segment dissection in the infrarenal portion. No evidence of aortic rupture. 3. 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  Subjective:  had elevated BP last night, increased ABD pain Vasc surgery recommends outpatient follow up with CT surgery  DRUG ALLERGIES: No Known Allergies   REVIEW OF SYSTEMS: CONSTITUTIONAL: No fever/chills. No fatigue, weakness. No weight gain, no weight loss. EYES: No blurry or double vision. ENT: No tinnitus. No postnasal drip. No redness or soreness of the oropharynx. RESPIRATORY: No cough, no wheeze, no hemoptysis. No dyspnea. CARDIOVASCULAR:  (+)chest pain. No orthopnea. No palpitations. No syncope. GASTROINTESTINAL: (+) nausea, no vomiting or diarrhea. (+) abdominal pain. No melena or hematochezia. GENITOURINARY: No dysuria or hematuria. ENDOCRINE: No polyuria or nocturia. No heat or cold intolerance. HEMATOLOGY: No anemia. No bruising. No bleeding. INTEGUMENTARY: No rashes. No lesions. MUSCULOSKELETAL: No arthritis. No swelling. No gout. NEUROLOGIC: No numbness, tingling, weakness or ataxia. No seizure-type activity. PSYCHIATRIC: No anxiety. No depression. No insomnia.  PHYSICAL EXAMINATION: VITAL SIGNS: Blood pressure 121/71, pulse 73, temperature 97.6 F (36.4 C), temperature source Axillary, resp. rate (!) 21, height 6\' 1"  (1.854 m), weight 194 lb 0.1 oz (88 kg), SpO2 95 %.  GENERAL: 65 y.o.-year-old white male patient, well-developed, well-nourished lying in the bed in no acute distress. EYES: Pupils equal, round, reactive to light and accommodation. No scleral icterus. Extraocular muscles intact. HEENT: Head atraumatic, normocephalic. Oropharynx and nasopharynx clear. Mucus membranes moist. NECK: Supple, full range of motion. No JVD, no bruit heard. No thyroid enlargement, no tenderness, no lymphadenopathy. CHEST: Normal breath sounds bilaterally, no wheezing, rales, rhonchi or crepitation. No use of accessory muscles of respiration.  No reproducible chest wall tenderness.  CARDIOVASCULAR: S1, S2 normal. No murmurs, rubs, or gallops. Cap refill <2 seconds. ABDOMEN: Soft,  nontender, nondistended. No palpable mass or bruit heard. No rebound, guarding, rigidity.  Normoactive bowel sounds present in all four quadrants. No organomegaly or mass. EXTREMITIES: Full range of motion. No pedal edema, cyanosis, or clubbing. NEUROLOGIC: Cranial nerves II through XII are grossly intact with no focal sensorimotor deficit. Muscle strength 5/5 in all extremities. Sensation intact. Gait not checked. PSYCHIATRIC: The patient is alert and oriented x 3. Normal affect, mood, thought content. SKIN: Warm, dry, and intact without obvious rash, lesion, or ulcer.  LABORATORY PANEL:  CBC  Recent Labs Lab 10/12/15 0110  WBC 16.6*  HGB 15.5  HCT 44.0  PLT 139*   ----------------------------------------------------------------------------------------------------------------- Chemistries  Recent Labs Lab 10/12/15 0110 10/12/15 0346  NA 135 136  K 3.6 3.6  CL 101 102  CO2 26 26  GLUCOSE 131* 142*  BUN 16 16  CREATININE 0.57* 0.59*  CALCIUM 8.7* 8.4*  MG  --  1.7  AST 57*  --   ALT 66*  --   ALKPHOS 69  --   BILITOT 1.6*  --    ------------------------------------------------------------------------------------------------------------------ Cardiac Enzymes  Recent Labs Lab 10/12/15 0346  TROPONINI <0.03   ------------------------------------------------------------------------------------------------------------------  RADIOLOGY: Dg Chest Port 1 View  Result Date: 10/12/2015 CLINICAL DATA:  Hypoxemia EXAM: PORTABLE CHEST 1 VIEW COMPARISON:  10/10/2015 FINDINGS: Increase in bibasilar atelectasis compared to the prior study. Decreased lung volume. Negative for edema or effusion. Heart size within normal limits. IMPRESSION: Interval increase in hypoventilation with bibasilar atelectasis. Electronically Signed   By: Marlan Palau M.D.   On: 10/12/2015 07:58   Dg Chest Portable 1 View  Result Date: 10/10/2015 CLINICAL DATA:  65 year old male with history of chest pain  since 1 p.m. today, with radiation to the right side. Emesis. No associated shortness of breath. Hypertension. EXAM: PORTABLE CHEST 1 VIEW COMPARISON:  Chest x-ray 09/02/2007. FINDINGS: Multiple tiny calcified granulomas are noted, most evident in the left lower lobe. Calcified left hilar lymph nodes. Lung volumes are normal. No consolidative airspace disease. No pleural effusions. No pneumothorax. No pulmonary nodule or mass noted. Pulmonary vasculature and the cardiomediastinal silhouette are within normal limits. IMPRESSION: No radiographic evidence of acute cardiopulmonary disease. Electronically Signed   By: Trudie Reed M.D.   On: 10/10/2015 17:43   Ct Angio Chest/abd/pel For Dissection W And/or Wo Contrast  Result Date: 10/10/2015 CLINICAL DATA:  Acute onset of epigastric pain today. Clinical concern for aortic dissection. EXAM: CT ANGIOGRAPHY CHEST, ABDOMEN AND PELVIS TECHNIQUE: Multidetector CT imaging through the chest, abdomen and pelvis was performed using the standard protocol during bolus administration of intravenous contrast. Multiplanar reconstructed images and MIPs were obtained and reviewed to evaluate the vascular anatomy. CONTRAST:  100 cc Isovue 370 IV COMPARISON:  No prior comparison exams. FINDINGS: CTA CHEST FINDINGS Vascular: Examination is positive for acute aortic syndrome with intramural hematoma arising just distal a takeoff of the great vessels extending to the level of the diaphragmatic hiatus. The intramural hematoma is hyperdense on noncontrast imaging. Small focus of extraluminal contrast is seen within the is intramural hematoma in the distal thoracic aorta image 75 series 2 vein may be a penetrating ulcer. There is subsequent aneurysmal dilatation of the descending thoracic aorta measuring up to 4 cm. The aorta at the level of the diaphragmatic hiatus measures 3.6 x 3.9 cm. No periaortic soft tissue stranding. There is a conventional branching pattern from the aortic arch.  There is atherosclerotic ossified noncalcified plaque at the origins of the great vessels, without hematoma involvement. There is diffuse calcified and noncalcified atheromatous plaque. No central pulmonary embolus. There  are coronary artery calcifications. Mediastinum: No enlarged mediastinal or hilar lymph nodes. No pericardial fluid. Lungs/pleura: Biapical pleural-parenchymal scarring. Mild emphysema. No confluent airspace disease. No evidence pulmonary edema. Trachea and mainstem bronchi are patent. There is no pleural effusion or pleural fluid. Musculoskeletal: There are no acute or suspicious osseous abnormalities. Multilevel degenerative change throughout the spine with multiple Schmorl's nodes. Review of the MIP images confirms the above findings. CTA ABDOMEN AND PELVIS FINDINGS Vascular: Fusiform infrarenal abdominal aortic aneurysm, proximal aspect less than 1 cm from the takeoff of the left renal artery. Maximal dimension 5.0 x 4.7 cm. Distal extent just proximal to the iliac bifurcation. There is peripherally eccentric thrombus about the right lateral aspect with a thin band of thrombus extending into the opacified lumen versus limited short-segment dissection. There is no periaortic soft tissue stranding. 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 Normal caliber left common iliac artery. The celiac, superior mesenteric, and inferior mesenteric arteries are patent. There is an accessory artery arising from the most abdominal aorta supplying the lesser curvature of the stomach. Single bilateral renal arteries are patent. There is diffuse calcified and noncalcified atheromatous plaque. Hepatobiliary: The liver is prominent size measuring 21 cm cranial caudal. No focal abnormality detected on arterial phase imaging. Gallbladder physiologically distended. No calcified stone. Pancreas:  No ductal dilatation or inflammation. Spleen:  Normal in size.  Normal arterial phase imaging  appearance. Adrenals:  No nodule. Kidneys: Symmetric enhancement. No hydronephrosis. Cyst in the upper left kidney. Stomach/bowel: Distal esophageal wall thickening with small hiatal hernia. Stomach physiologically distended. Suboptimal bowel assessment given lack of enteric contrast. No bowel dilatation. Moderate stool burden throughout the colon. Sigmoid colonic diverticulosis without diverticulitis. Appendix is normal. Lymphatics: No evidence of retroperitoneal, mesenteric or pelvic adenopathy. Bladder: Distended. Reproductive: Central prostatic calcifications. No acute abnormality. Other:  No free air free fluid.  No intra-abdominal abscess. Musculoskeletal: There are no acute or suspicious osseous abnormalities. Degenerative change throughout the lumbar spine, with endplate changes at L5-S1. Review of the MIP images confirms the above findings. IMPRESSION: 1. Positive for acute aortic syndrome in the chest with aortic hematoma originating just distal to the great vessels. 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 No periaortic soft tissue stranding. 2. Infrarenal abdominal aorta measuring 5.0 x 4.7 cm. Eccentric mural thrombus with thin band of thrombus versus short-segment dissection in the infrarenal portion. No evidence of aortic rupture. Recommend followup by abdomen and pelvis CTA in 3-6 months, and vascular surgery referral/consultation if not already obtained. This recommendation follows ACR consensus guidelines: White Paper of the ACR Incidental Findings Committee II on Vascular Findings. J Am Coll Radiol 2013; 10:789-794. 3. 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 4. Incidental chest findings of emphysema and coronary artery calcifications. 5. Incidental abdomen/pelvis findings of sigmoid colonic diverticulosis without evidence of diverticulitis. Critical Value/emergent preliminary results were  discussed by telephone at the time of interpretation on 10/10/2015 at 7:20 pm with Dr. Toney RakesERYKA GAYLE , who verbally acknowledged these results. Electronically Signed   By: Rubye OaksMelanie  Ehinger M.D.   On: 10/10/2015 19:46   [place] Abdomen Limited Ruq  Result Date: 10/10/2015 CLINICAL DATA:  Right upper quadrant pain for several hours, initial encounter EXAM: US ABDOMEN LIMITED - RIGHT UPPER QUADRANT COMPARISON:  None. FINDINGS: Gallbladder: Gallbladder is well distended with evidence of gallbladder sludge. Some nondependent echogenicities are noted along the gallbladder wall consistent with gallbladder  polyps. Tiny gallstones are noted as well. No significant pericholecystic fluid is noted. No wall thickening is seen. Common bile duct: Diameter: 3.3 mm. Liver: No focal lesion identified. Within normal limits in parenchymal echogenicity. Incidental note is made of dilatation of the mid infrarenal aorta. The maximum dimension is 5.3 cm. Echogenic line is noted within the aorta with color flow on both sides suggestive of dissection. IMPRESSION: Small gallstones, gallbladder polyps in gallbladder sludge. No sonographic Eulah Pont sign is noted. Changes suggestive of aortic aneurysm with possible dissection. A CT is already scheduled for further evaluation of this abnormality. Electronically Signed   By: Alcide Clever M.D.   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 Q waves in aVL, AVR, V1 and V2.   IMPRESSION AND PLAN:  This is a 65 y.o. 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   -DVT Px will be with SCDs only given hematoma and high risk of bleeding.   Increase oral hydralazine to 50 mg every 6 hrs  At this time, goal if for BP and pain control.  No other recs at this time, patient is at high risk for post op complications.     The Patient requires high complexity decision making for assessment and support, frequent evaluation and titration of therapies.  Patient  satisfied with Plan of action and management. All questions answered  Lucie Leather, M.D.  [facility]  Medical Director [facility] Medical Director [facility] Cardio-Pulmonary Department

## 2015-10-12 NOTE — Progress Notes (Signed)
During AM report off going RN reported to me that Nipride drip was not available to hang until after midnight. From the previous day shift on 10/11/2015 pharmacy made me aware that Nipride was having to be [facility]. I was in contact with pharmacy numerous times during the afternoon hours of 10/11/2015. The last conversation I had with pharmacy around 1700 on 10/11/2015 I spoke with a pharmacy technician and they stated that the N[facility] would arrive from [facility] before the end of the shift. I clarified and stated that I had just hung my last bag and would need a replacement bag in a few hours and wanted to make sure that I should not call the MD for a medication substitution. Pharmacy technician stated that I should not worry with calling MD because Nipride would arrive in time before the next bag was needed.

## 2015-10-12 NOTE — Progress Notes (Signed)
eLink Physician-Brief Progress Note Patient Name: Cory BlanksRobert D Wilson DOB: 01/05/1951 MRN: [id number]   Severe bp continues - 145/97 with HR 89-100. On cardene Also having some epi pain  Plan RN to check RUE, LUE, LLE, RLE bp + Check lactate, lft, liapse, trop amylase to understand epi pain  Rx gi cocktail + add ppi  + continue pepcid Continue cardene for bp Add nipride to above (now avail) Labetalol prn More freqwuent bp check  Consider inpatient transfer to [facility] > 7am 10/12/2015      Intervention Category Major Interventions: Hypertension - evaluation and management  Chrisma Hurlock 10/12/2015, 12:24 AM

## 2015-10-13 ENCOUNTER — Ambulatory Visit (HOSPITAL_COMMUNITY)
Admission: AD | Admit: 2015-10-13 | Discharge: 2015-10-13 | Disposition: A | Discharge status: Home / Self Care | Payer: Non-veteran care | Source: Other Acute Inpatient Hospital | Attending: Internal Medicine | Admitting: Internal Medicine

## 2015-10-13 DIAGNOSIS — I719 Aortic aneurysm of unspecified site, without rupture: Secondary | ICD-10-CM | POA: Insufficient documentation

## 2015-10-13 DIAGNOSIS — R1013 Epigastric pain: Secondary | ICD-10-CM

## 2015-10-13 DIAGNOSIS — R079 Chest pain, unspecified: Secondary | ICD-10-CM

## 2015-10-13 DIAGNOSIS — I700 Atherosclerosis of aorta: Secondary | ICD-10-CM

## 2015-10-13 LAB — CBC
HEMATOCRIT: 37.4 % — AB (ref 40.0–52.0)
HEMOGLOBIN: 13 g/dL (ref 13.0–18.0)
MCH: 30.6 pg (ref 26.0–34.0)
MCHC: 34.7 g/dL (ref 32.0–36.0)
MCV: 88.4 fL (ref 80.0–100.0)
Platelets: 139 10*3/uL — ABNORMAL LOW (ref 150–440)
RBC: 4.23 MIL/uL — ABNORMAL LOW (ref 4.40–5.90)
RDW: 13.5 % (ref 11.5–14.5)
WBC: 18.7 10*3/uL — ABNORMAL HIGH (ref 3.8–10.6)

## 2015-10-13 LAB — BASIC METABOLIC PANEL
ANION GAP: 5 (ref 5–15)
BUN: 27 mg/dL — ABNORMAL HIGH (ref 6–20)
CHLORIDE: 104 mmol/L (ref 101–111)
CO2: 23 mmol/L (ref 22–32)
CREATININE: 0.63 mg/dL (ref 0.61–1.24)
Calcium: 7.6 mg/dL — ABNORMAL LOW (ref 8.9–10.3)
GFR calc non Af Amer: >60 mL/min (ref >60)
Glucose, Bld: 138 mg/dL — ABNORMAL HIGH (ref 65–99)
POTASSIUM: 3.5 mmol/L (ref 3.5–5.1)
Sodium: 132 mmol/L — ABNORMAL LOW (ref 135–145)

## 2015-10-13 MED ORDER — HYDROMORPHONE HCL 1 MG/ML IJ SOLN
2.0000 mg | Freq: Once | INTRAMUSCULAR | Status: AC
  Administered 2015-10-13: 2 mg via INTRAVENOUS

## 2015-10-13 MED ORDER — PNEUMOCOCCAL VAC POLYVALENT 25 MCG/0.5ML IJ INJ: 0.5000 mL | INJECTION | INTRAMUSCULAR | Status: AC

## 2015-10-13 MED ORDER — ZOLPIDEM TARTRATE 5 MG PO TABS: 5.0000 mg | ORAL_TABLET | Freq: Every evening | ORAL | 0 refills | Status: AC | PRN

## 2015-10-13 MED ORDER — SODIUM CHLORIDE 0.9% FLUSH: 9.0000 mL | INTRAVENOUS | Status: DC | PRN

## 2015-10-13 MED ORDER — MOMETASONE FURO-FORMOTEROL FUM 100-5 MCG/ACT IN AERO: 2.0000 | INHALATION_SPRAY | Freq: Two times a day (BID) | RESPIRATORY_TRACT | Status: AC

## 2015-10-13 MED ORDER — NALOXONE HCL 0.4 MG/ML IJ SOLN: 0.4000 mg | INTRAMUSCULAR | Status: DC | PRN

## 2015-10-13 MED ORDER — NITROPRUSSIDE SODIUM 25 MG/ML IV SOLN: 0.0000 ug/kg/min | INTRAVENOUS | Status: AC

## 2015-10-13 MED ORDER — TIOTROPIUM BROMIDE MONOHYDRATE 18 MCG IN CAPS: 18.0000 ug | ORAL_CAPSULE | Freq: Every day | RESPIRATORY_TRACT | 12 refills | Status: AC

## 2015-10-13 MED ORDER — DIPHENHYDRAMINE HCL 50 MG/ML IJ SOLN: 12.5000 mg | Freq: Four times a day (QID) | INTRAMUSCULAR | Status: DC | PRN

## 2015-10-13 MED ORDER — DIPHENHYDRAMINE HCL 12.5 MG/5ML PO ELIX: 12.5000 mg | ORAL_SOLUTION | Freq: Four times a day (QID) | ORAL | Status: DC | PRN

## 2015-10-13 MED ORDER — IPRATROPIUM-ALBUTEROL 0.5-2.5 (3) MG/3ML IN SOLN: 3.0000 mL | RESPIRATORY_TRACT | Status: AC

## 2015-10-13 MED ORDER — PANTOPRAZOLE SODIUM 40 MG PO TBEC: 40.0000 mg | DELAYED_RELEASE_TABLET | Freq: Every day | ORAL | Status: AC

## 2015-10-13 MED ORDER — ALBUTEROL SULFATE (2.5 MG/3ML) 0.083% IN NEBU: 2.5000 mg | INHALATION_SOLUTION | RESPIRATORY_TRACT | 12 refills | Status: AC | PRN

## 2015-10-13 MED ORDER — SODIUM CHLORIDE 0.9 % IV SOLN: 250.0000 mL | INTRAVENOUS | 0 refills | Status: AC | PRN

## 2015-10-13 MED ORDER — LABETALOL HCL 5 MG/ML IV SOLN: 20.0000 mg | INTRAVENOUS | Status: AC | PRN

## 2015-10-13 MED ORDER — NICOTINE 21 MG/24HR TD PT24: 21.0000 mg | MEDICATED_PATCH | Freq: Every day | TRANSDERMAL | 0 refills | Status: AC

## 2015-10-13 MED ORDER — LABETALOL HCL 5 MG/ML IV SOLN
0.5000 mg/min | INTRAVENOUS | Status: DC
  Administered 2015-10-13: 0.5 mg/min via INTRAVENOUS
  Filled 2015-10-13: qty 100

## 2015-10-13 MED ORDER — ONDANSETRON HCL 4 MG/2ML IJ SOLN: 4.0000 mg | Freq: Four times a day (QID) | INTRAMUSCULAR | 0 refills | Status: AC | PRN

## 2015-10-13 MED ORDER — NICARDIPINE HCL IN NACL 20-0.86 MG/200ML-% IV SOLN: 3.0000 mg/h | INTRAVENOUS | Status: AC

## 2015-10-13 MED ORDER — LOSARTAN POTASSIUM 50 MG PO TABS: 50.0000 mg | ORAL_TABLET | Freq: Every day | ORAL | Status: AC

## 2015-10-13 MED ORDER — SENNA 8.6 MG PO TABS: 1.0000 | ORAL_TABLET | Freq: Every day | ORAL | 0 refills | Status: AC

## 2015-10-13 MED ORDER — HYDRALAZINE HCL 25 MG PO TABS: 25.0000 mg | ORAL_TABLET | Freq: Three times a day (TID) | ORAL | Status: DC

## 2015-10-13 MED ORDER — CETYLPYRIDINIUM CHLORIDE 0.05 % MT LIQD: 7.0000 mL | Freq: Two times a day (BID) | OROMUCOSAL | 0 refills | Status: AC

## 2015-10-13 MED ORDER — ONDANSETRON HCL 4 MG/2ML IJ SOLN
4.0000 mg | Freq: Four times a day (QID) | INTRAMUSCULAR | Status: DC | PRN
  Administered 2015-10-13: 4 mg via INTRAVENOUS

## 2015-10-13 MED ORDER — HYDROMORPHONE HCL 1 MG/ML IJ SOLN: 1.0000 mg | INTRAMUSCULAR | 0 refills | Status: AC | PRN

## 2015-10-13 MED ORDER — ONDANSETRON HCL 4 MG/2ML IJ SOLN: 4.0000 mg | Freq: Four times a day (QID) | INTRAMUSCULAR | Status: DC | PRN

## 2015-10-13 MED ORDER — SODIUM CHLORIDE 0.9 % IV SOLN: 100.0000 mL | INTRAVENOUS | 0 refills | Status: AC

## 2015-10-13 MED ORDER — HYDRALAZINE HCL 25 MG PO TABS: 25.0000 mg | ORAL_TABLET | Freq: Three times a day (TID) | ORAL | Status: AC

## 2015-10-13 MED ORDER — FAMOTIDINE IN NACL 20-0.9 MG/50ML-% IV SOLN: 20.0000 mg | Freq: Two times a day (BID) | INTRAVENOUS | Status: AC

## 2015-10-13 NOTE — H&P (Signed)
---------------------------------------------------------------------------------------------------------------------   PATIENT NAME: Cory Wilson MR#: [id number] DATE OF BIRTH: May 04, 1950 DATE OF ADMISSION: 10/10/2015 PRIMARY CARE PHYSICIAN: No primary care provider on file.  REQUESTING/REFERRING PHYSICIAN: ED Dr. Darnelle Catalan  CHIEF COMPLAINT: Chief Complaint  Patient presents with  . Chest Pain  . Abdominal Pain    HISTORY OF PRESENT ILLNESS: Cory Wilson is a 65 y.o. male with history of hepatitis C started having 10/10 "pulsating" chest pain in the lower mid-sternum association with nausea, dry heaves, shortness of breath and diaphoresis. -  Pain was relieved after EMS placed nitropaste.  - He states that he has had "heartburn" in the same region intermittently over the past few months which seemed to have been relieved by Rolaids.   -He has not had a physical exam in the past 7 years, and has never had a cardiac workup.    Otherwise there has been no change in status. There has been no recent change in medication or diet.  There has been no recent illness, travel or sick contacts.    Patient denies fevers/chills, weakness, dizziness, C/D, dysuria/frequency, changes in mental status.   EMS/ED COURSE:  Patient rec'd nitropaste in the ambulance was started on a nitroprusside drip in the ED for elevated BP  CTA  Findings as reviewed below  1. Positive for acute aortic syndrome in the chest with aortic hematoma originating just distal to the great vessels. 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 No periaortic soft tissue stranding. 2. Infrarenal abdominal aorta measuring 5.0 x 4.7 cm. Eccentric mural thrombus with thin band of thrombus versus short-segment dissection in the infrarenal portion. No evidence of aortic rupture. 3. 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  Subjective:  had elevated BP last night, increased ABD pain Vasc surgery recommends outpatient follow up with CT surgery  Patient in pain, [facility] surgery assessment:very complex finding of what appears to be a penetrating ulcer with an intramural hematoma in the proximal descending thoracic aorta at the left subclavian artery. This continues down through the thoracic artery which is dilated and aneurysmal through the visceral vessels and he then has about a 5 cm infrarenal aortic aneurysm component.   DRUG ALLERGIES: No Known Allergies   REVIEW OF SYSTEMS: CONSTITUTIONAL: No fever/chills. No fatigue, weakness. No weight gain, no weight loss. EYES: No blurry or double vision. ENT: No tinnitus. No postnasal drip. No redness or soreness of the oropharynx. RESPIRATORY: +sob CARDIOVASCULAR:  (+)chest pain. No orthopnea. No palpitations. No syncope. GASTROINTESTINAL: (+) nausea, no vomiting or diarrhea. (+) abdominal pain. No melena or hematochezia. Al other ROS negative  PHYSICAL EXAMINATION: VITAL SIGNS: Blood pressure 100/69, pulse 84, temperature 99.7 F (37.6 C), temperature source Oral, resp. rate (!) 22, height  (1.854 m), weight 200 lb 6.4 oz (90.9 kg), SpO2 93 %.  GENERAL: 65 y.o.-year-old white male patient, well-developed, well-nourished lying in the bed in + acute distress. EYES: Pupils equal, round, reactive to light and accommodation. No scleral icterus. Extraocular muscles intact. HEENT: Head atraumatic, normocephalic. Oropharynx and nasopharynx clear. Mucus membranes moist. NECK: Supple, full range of motion. No JVD, no bruit heard. No thyroid enlargement, no tenderness, no lymphadenopathy. CHEST: Normal breath sounds bilaterally, no wheezing, rales, rhonchi or crepitation. No use of accessory muscles of respiration.  No reproducible chest wall tenderness.  CARDIOVASCULAR: S1, S2 normal. No murmurs, rubs, or gallops. Cap refill <2 seconds. ABDOMEN: Soft, nontender, nondistended. No  palpable mass  or bruit heard. No rebound, guarding, rigidity. Normoactive bowel sounds present in all four quadrants. No organomegaly or mass. EXTREMITIES: Full range of motion. No pedal edema, cyanosis, or clubbing. NEUROLOGIC: Cranial nerves II through XII are grossly intact with no focal sensorimotor deficit. Muscle strength 5/5 in all extremities. Sensation intact. Gait not checked. PSYCHIATRIC: The patient is alert and oriented x 3. Normal affect, mood, thought content. SKIN: Warm, dry, and intact without obvious rash, lesion, or ulcer.  LABORATORY PANEL:  CBC  Recent Labs Lab 10/12/15 0110  WBC 16.6*  HGB 15.5  HCT 44.0  PLT 139*   ----------------------------------------------------------------------------------------------------------------- Chemistries  Recent Labs Lab 10/12/15 0110 10/12/15 0346  NA 135 136  K 3.6 3.6  CL 101 102  CO2 26 26  GLUCOSE 131* 142*  BUN 16 16  CREATININE 0.57* 0.59*  CALCIUM 8.7* 8.4*  MG  --  1.7  AST 57*  --   ALT 66*  --   ALKPHOS 69  --   BILITOT 1.6*  --    ------------------------------------------------------------------------------------------------------------------ Cardiac Enzymes  Recent Labs Lab 10/12/15 0346  TROPONINI <0.03   ------------------------------------------------------------------------------------------------------------------  RADIOLOGY: Dg Chest Port 1 View  Result Date: 10/12/2015 CLINICAL DATA:  Hypoxemia EXAM: PORTABLE CHEST 1 VIEW COMPARISON:  10/10/2015 FINDINGS: Increase in bibasilar atelectasis compared to the prior study. Decreased lung volume. Negative for edema or effusion. Heart size within normal limits. IMPRESSION: Interval increase in hypoventilation with bibasilar atelectasis. Electronically Signed   By: Marlan Palauharles  Clark M.D.   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 Q waves in aVL, AVR, V1 and V2.   IMPRESSION AND  PLAN:  This is a 65 y.o. male  with a history of hepatitis C now being admitted with: -Acute aortic syndrome with hematoma with penetrating ulcer into Left Subclavian Vein After discussion with dr Wyn Quakerew, patient needs extensive open repair-will need to establish care at tertiary care center -will need aggressive BP control, pain control - nitroprusside drip for BP control, with a goal systolic 100-120 - Will use Morphine for pain control, Zofran, Pepcid for GI prophylaxis.   -DVT Px will be with SCDs only given hematoma and high risk of bleeding.   Increase oral hydralazine to 50 mg every 6 hrs  At this time, goal if for BP and pain control.  No other recs at this time, patient is at high risk for post op complications.  Vasc Surgery Recs:  very complex finding of what appears to be a penetrating ulcer with an intramural hematoma in the proximal descending thoracic aorta at the left subclavian artery. This continues down through the thoracic artery which is dilated and aneurysmal through the visceral vessels and he then has about a 5 cm infrarenal aortic aneurysm component.     The Patient requires high complexity decision making for assessment and support, frequent evaluation and titration of therapies.  Recommend Transfer to [facility] for further care and management. Awaiting call back.   Lucie LeatherKurian David Tasheena Wambolt, M.D.  [facility]  Medical Director [facility] Medical Director [facility] Cardio-Pulmonary Department

## 2015-10-13 NOTE — Progress Notes (Signed)
Report called to Georgiann HahnKat, RN on [facility].

## 2015-10-13 NOTE — Progress Notes (Signed)
Patient Name: Cory BlanksRobert D Wilson Date of Encounter: 10/13/2015  No care team member to display  PROBLEM LIST  Active Problems:   Intramural aortic hematoma (HCC)   Pain in the chest   Epigastric pain   Tobacco use   Penetrating atherosclerotic ulcer of aorta (HCC)   Dissection of aorta, unspecified site   Tobacco use disorder   Essential hypertension     SUBJECTIVE  BP controlled but still on 2 drips. HR has been in the 80s. Continues to receive pain medication for CP.  He is +~ 4L since admission.   CURRENT MEDS . antiseptic oral rinse  7 mL Mouth Rinse BID  . famotidine (PEPCID) IV  20 mg Intravenous Q12H  . hydrALAZINE  25 mg Oral Q8H  . ipratropium-albuterol  3 mL Nebulization Q4H  . losartan  50 mg Oral Daily  . mometasone-formoterol  2 puff Inhalation BID  . nicotine  21 mg Transdermal Daily  . pantoprazole  40 mg Oral Q1200  . pneumococcal 23 valent vaccine  0.5 mL Intramuscular Tomorrow-1000  . senna  1 tablet Oral Daily  . tiotropium  18 mcg Inhalation Daily    OBJECTIVE  Vitals:   10/13/15 0715 10/13/15 0730 10/13/15 0754 10/13/15 0800  BP: 114/72 100/69    Pulse: 85 84    Resp: (!) 21 (!) 22    Temp:    99.7 F (37.6 C)  TempSrc:    Oral  SpO2: 93% 92% 93%   Weight:      Height:        Intake/Output Summary (Last 24 hours) at 10/13/15 0839 Last data filed at 10/13/15 0700  Gross per 24 hour  Intake          4927.24 ml  Output             1275 ml  Net          3652.24 ml   Filed Weights   10/11/15 0207 10/12/15 0456 10/13/15 0500  Weight: 193 lb 9 oz (87.8 kg) 194 lb 0.1 oz (88 kg) 200 lb 6.4 oz (90.9 kg)    PHYSICAL EXAM VS:  BP 100/69   Pulse 84   Temp 99.7 F (37.6 C) (Oral)   Resp (!) 22   Ht 6\' 1"  (1.854 m)   Wt 200 lb 6.4 oz (90.9 kg)   SpO2 93%   BMI 26.44 kg/m  , BMI Body mass index is 26.44 kg/m. GENERAL:  well developed, well nourished, not in acute distress HEENT: normocephalic, pink conjunctivae, anicteric sclerae, no  xanthelasma, normal dentition, oropharynx clear NECK:  no neck vein engorgement, JVP normal, no hepatojugular reflux, carotid upstroke brisk and symmetric, no bruit, no thyromegaly, no lymphadenopathy LUNGS:  good respiratory effort, somewhat decreased breath sounds bilateral bases CV:  PMI not displaced, no thrills, no lifts, S1 and S2 within normal limits, no palpable S3 or S4, no murmurs, no rubs, no gallops ABD:  Soft, nontender, nondistended, normoactive bowel sounds, no abdominal aortic bruit, no hepatomegaly, no splenomegaly MS: nontender back, no kyphosis, no scoliosis, no joint deformities EXT:  1+ DP/PT pulses, no edema, no varicosities, no cyanosis, no clubbing SKIN: warm, nondiaphoretic, normal turgor, no ulcers NEUROPSYCH: alert, oriented to person, place, and time, sensory/motor grossly intact, normal mood, appropriate affect   Accessory Clinical Findings  CBC  Recent Labs  10/11/15 0424 10/12/15 0110  WBC 12.3* 16.6*  HGB 14.9 15.5  HCT 42.4 44.0  MCV 87.5 87.2  PLT  139* 139*   Basic Metabolic Panel  Recent Labs  10/10/15 1701 10/11/15 0424 10/12/15 0110 10/12/15 0346  NA 136 137 135 136  K 3.8 4.4 3.6 3.6  CL 103 102 101 102  CO2 GLUCOSE 115* 169* 131* 142*  BUN CREATININE 0.67 0.74 0.57* 0.59*  CALCIUM 9.5 9.0 8.7* 8.4*  MG 2.0 2.0  --  1.7  PHOS 2.9 2.9  --   --    Liver Function Tests  Recent Labs  10/10/15 1701 10/12/15 0110  AST 79* 57*  ALT 80* 66*  ALKPHOS 72 69  BILITOT 0.6 1.6*  PROT 7.3 7.8  ALBUMIN 3.9 3.8    Recent Labs  10/10/15 1701 10/12/15 0110  LIPASE 33 22   Cardiac Enzymes  Recent Labs  10/10/15 1701 10/11/15 0909 10/12/15 0346  TROPONINI <0.03 <0.03 <0.03   BNP (last 3 results) No results for input(s): BNP in the last 8760 hours. D-Dimer No results for input(s): DDIMER in the last 72 hours. Hemoglobin A1C No results for input(s): HGBA1C in the last 72 hours. Fasting Lipid  Panel  Recent Labs  10/10/15 1701  CHOL 171  HDL 28*  LDLCALC 66  TRIG 161*  CHOLHDL 6.1   Thyroid Function Tests No results for input(s): TSH, T4TOTAL, T3FREE, THYROIDAB in the last 72 hours.  Invalid input(s): FREET3  TELE  SR  RADIOLOGY/STUDIES  Dg Chest Port 1 View  Result Date: 10/12/2015 CLINICAL DATA:  Hypoxemia EXAM: PORTABLE CHEST 1 VIEW COMPARISON:  10/10/2015 FINDINGS: Increase in bibasilar atelectasis compared to the prior study. Decreased lung volume. Negative for edema or effusion. Heart size within normal limits. IMPRESSION: Interval increase in hypoventilation with bibasilar atelectasis. Electronically Signed   By: Marlan Palau M.D.   On: 10/12/2015 07:58   Dg Chest Portable 1 View  Result Date: 10/10/2015 CLINICAL DATA:  65 year old male with history of chest pain since 1 p.m. today, with radiation to the right side. Emesis. No associated shortness of breath. Hypertension. EXAM: PORTABLE CHEST 1 VIEW COMPARISON:  Chest x-ray 09/02/2007. FINDINGS: Multiple tiny calcified granulomas are noted, most evident in the left lower lobe. Calcified left hilar lymph nodes. Lung volumes are normal. No consolidative airspace disease. No pleural effusions. No pneumothorax. No pulmonary nodule or mass noted. Pulmonary vasculature and the cardiomediastinal silhouette are within normal limits. IMPRESSION: No radiographic evidence of acute cardiopulmonary disease. Electronically Signed   By: Trudie Reed M.D.   On: 10/10/2015 17:43   Ct Angio Chest/abd/pel For Dissection W And/or Wo Contrast  Result Date: 10/10/2015 CLINICAL DATA:  Acute onset of epigastric pain today. Clinical concern for aortic dissection. EXAM: CT ANGIOGRAPHY CHEST, ABDOMEN AND PELVIS TECHNIQUE: Multidetector CT imaging through the chest, abdomen and pelvis was performed using the standard protocol during bolus administration of intravenous contrast. Multiplanar reconstructed images and MIPs were obtained and  reviewed to evaluate the vascular anatomy. CONTRAST:  100 cc Isovue 370 IV COMPARISON:  No prior comparison exams. FINDINGS: CTA CHEST FINDINGS Vascular: Examination is positive for acute aortic syndrome with intramural hematoma arising just distal a takeoff of the great vessels extending to the level of the diaphragmatic hiatus. The intramural hematoma is hyperdense on noncontrast imaging. Small focus of extraluminal contrast is seen within the is intramural hematoma in the distal thoracic aorta image 75 series 2 vein may be a penetrating ulcer. There is subsequent aneurysmal dilatation of the descending thoracic aorta measuring up to 4 cm.  The aorta at the level of the diaphragmatic hiatus measures 3.6 x 3.9 cm. No periaortic soft tissue stranding. There is a conventional branching pattern from the aortic arch. There is atherosclerotic ossified noncalcified plaque at the origins of the great vessels, without hematoma involvement. There is diffuse calcified and noncalcified atheromatous plaque. No central pulmonary embolus. There are coronary artery calcifications. Mediastinum: No enlarged mediastinal or hilar lymph nodes. No pericardial fluid. Lungs/pleura: Biapical pleural-parenchymal scarring. Mild emphysema. No confluent airspace disease. No evidence pulmonary edema. Trachea and mainstem bronchi are patent. There is no pleural effusion or pleural fluid. Musculoskeletal: There are no acute or suspicious osseous abnormalities. Multilevel degenerative change throughout the spine with multiple Schmorl's nodes. Review of the MIP images confirms the above findings. CTA ABDOMEN AND PELVIS FINDINGS Vascular: Fusiform infrarenal abdominal aortic aneurysm, proximal aspect less than 1 cm from the takeoff of the left renal artery. Maximal dimension 5.0 x 4.7 cm. Distal extent just proximal to the iliac bifurcation. There is peripherally eccentric thrombus about the right lateral aspect with a thin band of thrombus  extending into the opacified lumen versus limited short-segment dissection. There is no periaortic soft tissue stranding. 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 Normal caliber left common iliac artery. The celiac, superior mesenteric, and inferior mesenteric arteries are patent. There is an accessory artery arising from the most abdominal aorta supplying the lesser curvature of the stomach. Single bilateral renal arteries are patent. There is diffuse calcified and noncalcified atheromatous plaque. Hepatobiliary: The liver is prominent size measuring 21 cm cranial caudal. No focal abnormality detected on arterial phase imaging. Gallbladder physiologically distended. No calcified stone. Pancreas:  No ductal dilatation or inflammation. Spleen:  Normal in size.  Normal arterial phase imaging appearance. Adrenals:  No nodule. Kidneys: Symmetric enhancement. No hydronephrosis. Cyst in the upper left kidney. Stomach/bowel: Distal esophageal wall thickening with small hiatal hernia. Stomach physiologically distended. Suboptimal bowel assessment given lack of enteric contrast. No bowel dilatation. Moderate stool burden throughout the colon. Sigmoid colonic diverticulosis without diverticulitis. Appendix is normal. Lymphatics: No evidence of retroperitoneal, mesenteric or pelvic adenopathy. Bladder: Distended. Reproductive: Central prostatic calcifications. No acute abnormality. Other:  No free air free fluid.  No intra-abdominal abscess. Musculoskeletal: There are no acute or suspicious osseous abnormalities. Degenerative change throughout the lumbar spine, with endplate changes at L5-S1. Review of the MIP images confirms the above findings. IMPRESSION: 1. Positive for acute aortic syndrome in the chest with aortic hematoma originating just distal to the great vessels. 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 No  periaortic soft tissue stranding. 2. Infrarenal abdominal aorta measuring 5.0 x 4.7 cm. Eccentric mural thrombus with thin band of thrombus versus short-segment dissection in the infrarenal portion. No evidence of aortic rupture. Recommend followup by abdomen and pelvis CTA in 3-6 months, and vascular surgery referral/consultation if not already obtained. This recommendation follows ACR consensus guidelines: White Paper of the ACR Incidental Findings Committee II on Vascular Findings. J Am Coll Radiol 2013; 10:789-794. 3. 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 4. Incidental chest findings of emphysema and coronary artery calcifications. 5. Incidental abdomen/pelvis findings of sigmoid colonic diverticulosis without evidence of diverticulitis. Critical Value/emergent preliminary results were discussed by telephone at the time of interpretation on 10/10/2015 at 7:20 pm with Dr. Toney Rakes , who verbally acknowledged these results. Electronically Signed   By: Rubye Oaks M.D.   On:  10/10/2015 19:46   US Abdomen Limited Ruq  Result Date: 10/10/2015 CLINICAL DATA:  Right upper quadrant pain for several hours, initial encounter EXAM: US ABDOMEN LIMITED - RIGHT UPPER QUADRANT COMPARISON:  None. FINDINGS: Gallbladder: Gallbladder is well distended with evidence of gallbladder sludge. Some nondependent echogenicities are noted along the gallbladder wall consistent with gallbladder polyps. Tiny gallstones are noted as well. No significant pericholecystic fluid is noted. No wall thickening is seen. Common bile duct: Diameter: 3.3 mm. Liver: No focal lesion identified. Within normal limits in parenchymal echogenicity. Incidental note is made of dilatation of the mid infrarenal aorta. The maximum dimension is 5.3 cm. Echogenic line is noted within the aorta with color flow on both sides suggestive of dissection. IMPRESSION: Small gallstones, gallbladder polyps in gallbladder sludge. No  sonographic Eulah Pont sign is noted. Changes suggestive of aortic aneurysm with possible dissection. A CT is already scheduled for further evaluation of this abnormality. Electronically Signed   By: Alcide Clever M.D.   On: 10/10/2015 18:55   Echo 10/11/2015: Left ventricle: The cavity size was normal. There was moderate   concentric hypertrophy. Systolic function was normal. The   estimated ejection fraction was in the range of 60% to 65%. Wall   motion was normal; there were no regional wall motion   abnormalities. Doppler parameters are consistent with abnormal   left ventricular relaxation (grade 1 diastolic dysfunction).   ASSESSMENT AND PLAN HTN, in setting of intramural aortic hematoma with penetrating ulcer Continue with blood pressure control with nicardipine drip and nitroprusside drip.  Because of increase in HR, will reduce dose of Hydralazine to 25mg  po tid. Ordered labs. If K ok, will likely benefit from lasix 20mg  IV x 1.  Eventually, rec trial with Labetalol 100mg  po bid while monitoring HR. Continue Losartan at 50mg  po qd. Eventually, aim to wean off drips if possible at all. Continue CP control.   Management of the intramural hematoma per vascular surgery. Referral to tertiary care center per vascular surgery note on day of admission.  LVEF within normal limits on echocardiogram.  Tobacco use Smoking cessation recommended  Total encounter time is 35 minutes  Signed, Almond Lint, MD  10/13/2015, 8:39 AM  [facility]

## 2015-10-13 NOTE — Care Management (Signed)
This RNCM has [facility]

## 2015-10-13 NOTE — Discharge Summary (Signed)
[employer] [facility] [place] [facility]   PATIENT NAME: Cory Wilson    MR#:  [id number]  DATE OF BIRTH:  Jul 23, 1950  DATE OF ADMISSION:  10/10/2015 ADMITTING PHYSICIAN: Delfino Lovett, MD  DATE OF DISCHARGE: 10/13/15  PRIMARY CARE PHYSICIAN: No primary care provider on file.    ADMISSION DIAGNOSIS:  Epigastric pain [R10.13] Pain [R52] Intramural aortic hematoma (HCC) [I71.00] Essential hypertension [I10] Penetrating atherosclerotic ulcer of aorta (HCC) [I70.0] Chest pain, unspecified chest pain type [R07.9]  DISCHARGE DIAGNOSIS:  Active Problems:   Intramural aortic hematoma (HCC)   Penetrating atherosclerotic ulcer of aorta (HCC)   Dissection of aorta, unspecified site   Tobacco use    malignant hypertension   SECONDARY DIAGNOSIS:   Past Medical History:  Diagnosis Date  . Hepatitis C     HOSPITAL COURSE:  Cory Wilson  is a 65 y.o. male admitted 10/10/2015 with chief complaint Chest Pain and Abdominal Pain . Please see H&P performed by Delfino Lovett, MD for further information. Patient presented with the above complaints of chest pain. Through work up revealed acute aortic syndrome with hematoma and penetrating ulcer. Started on nitroprusside drip initially for blood pressure control. Vascular surgery involved at point of admission. Cardiology became involved to assist in blood pressure control - the addition of nicardipine drip  It was discussed on admission that vascular surgery felt this would be a complex scenario especially since the hematoma originates at the left subclavian artery.  requiring a complex thoracoabdominal repair or a deep branching procedure with a fenestrated stent graft. These are not procedures that we perform at this hospital. With recommendation of referral to a tertiary care center.  The patient continued to have symptoms of chest pain despite hydromorphone and adequate blood pressure control. Given continuing symptoms - case discussed  with cardiothoracic surgery at [facility] who agree to accept the patient.    CTA findings on admission CTA  Findings as reviewed below  1. Positive for acute aortic syndrome in the chest with aortic hematoma originating just distal to the great vessels. 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 No periaortic soft tissue stranding. 2. Infrarenal abdominal aorta measuring 5.0 x 4.7 cm. Eccentric mural thrombus with thin band of thrombus versus short-segment dissection in the infrarenal portion. No evidence of aortic rupture. 3. 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  DISCHARGE CONDITIONS:   Transfer to [facility]  CONSULTS OBTAINED:  Treatment Team:  Erin Fulling, MD Iran Ouch, MD  DRUG ALLERGIES:  No Known Allergies  DISCHARGE MEDICATIONS:   Current Discharge Medication List    CONTINUE these medications which have NOT CHANGED   Details  aspirin EC 81 MG tablet Take 81 mg by mouth daily.    Fish Oil-Cholecalciferol (FISH OIL + D3 PO) Take 1 capsule by mouth daily.    Multiple Vitamins-Minerals (MULTIVITAMIN WITH MINERALS) tablet Take 1 tablet by mouth daily.         DISCHARGE INSTRUCTIONS:    DIET:  Cardiac diet  DISCHARGE CONDITION:  Stable  ACTIVITY:  Activity as tolerated  OXYGEN:  Home Oxygen: No.   Oxygen Delivery: room air  DISCHARGE LOCATION:  [facility]  If you experience worsening of your admission symptoms, develop shortness of breath, life threatening emergency, suicidal or homicidal thoughts you must seek medical attention immediately by calling 911 or calling your MD immediately  if symptoms less severe.  You Must read  complete instructions/literature along with all the possible adverse reactions/side effects for all the Medicines you take and that have been prescribed to you. Take any new Medicines after you have completely understood and accpet  all the possible adverse reactions/side effects.   Please note  You were cared for by a hospitalist during your hospital stay. If you have any questions about your discharge medications or the care you received while you were in the hospital after you are discharged, you can call the unit and asked to speak with the hospitalist on call if the hospitalist that took care of you is not available. Once you are discharged, your primary care physician will handle any further medical issues. Please note that NO REFILLS for any discharge medications will be authorized once you are discharged, as it is imperative that you return to your primary care physician (or establish a relationship with a primary care physician if you do not have one) for your aftercare needs so that they can reassess your need for medications and monitor your lab values.    On the day of Discharge:   VITAL SIGNS:  Blood pressure 100/69, pulse 84, temperature 99.7 F (37.6 C), temperature source Oral, resp. rate (!) 22, height  (1.854 m), weight 90.9 kg (200 lb 6.4 oz), SpO2 93 %.  I/O:   Intake/Output Summary (Last 24 hours) at 10/13/15 0908 Last data filed at 10/13/15 0700  Gross per 24 hour  Intake          4927.24 ml  Output             1275 ml  Net          3652.24 ml    PHYSICAL EXAMINATION:  GENERAL:  65 y.o.-year-old patient lying in the bed with no acute distress.  EYES: Pupils equal, round, reactive to light and accommodation. No scleral icterus. Extraocular muscles intact.  HEENT: Head atraumatic, normocephalic. Oropharynx and nasopharynx clear.  NECK:  Supple, no jugular venous distention. No thyroid enlargement, no tenderness.  LUNGS: Normal breath sounds bilaterally, no wheezing, rales,rhonchi or crepitation. No use of accessory muscles of respiration.  CARDIOVASCULAR: S1, S2 normal. No murmurs, rubs, or gallops.  ABDOMEN: Soft, non-tender, non-distended. Bowel sounds present. No organomegaly or mass.    EXTREMITIES: No pedal edema, cyanosis, or clubbing.  NEUROLOGIC: Cranial nerves II through XII are intact. Muscle strength 5/5 in all extremities. Sensation intact. Gait not checked.  PSYCHIATRIC: The patient is alert and oriented x 3.  SKIN: No obvious rash, lesion, or ulcer.   DATA REVIEW:   CBC  Recent Labs Lab 10/12/15 0110  WBC 16.6*  HGB 15.5  HCT 44.0  PLT 139*    Chemistries   Recent Labs Lab 10/12/15 0110 10/12/15 0346  NA 135 136  K 3.6 3.6  CL 101 102  CO2 26 26  GLUCOSE 131* 142*  BUN 16 16  CREATININE 0.57* 0.59*  CALCIUM 8.7* 8.4*  MG  --  1.7  AST 57*  --   ALT 66*  --   ALKPHOS 69  --   BILITOT 1.6*  --     Cardiac Enzymes  Recent Labs Lab 10/12/15 0346  TROPONINI <0.03    Microbiology Results  Results for orders placed or performed during the hospital encounter of 10/10/15  MRSA PCR Screening     Status: None   Collection Time: 10/11/15  1:20 AM  Result Value Ref Range Status   MRSA by PCR NEGATIVE NEGATIVE Final  Comment:        The GeneXpert MRSA Assay (FDA approved for NASAL specimens only), is one component of a comprehensive MRSA colonization surveillance program. It is not intended to diagnose MRSA infection nor to guide or monitor treatment for MRSA infections.     RADIOLOGY:  Dg Chest Port 1 View  Result Date: 10/12/2015 CLINICAL DATA:  Hypoxemia EXAM: PORTABLE CHEST 1 VIEW COMPARISON:  10/10/2015 FINDINGS: Increase in bibasilar atelectasis compared to the prior study. Decreased lung volume. Negative for edema or effusion. Heart size within normal limits. IMPRESSION: Interval increase in hypoventilation with bibasilar atelectasis. Electronically Signed   By: Marlan Palauharles  Clark M.D.   On: 10/12/2015 07:58     Management plans discussed with the patient, family and they are in agreement.  CODE STATUS:     Code Status Orders        Start     Ordered   10/11/15 0041  Full code  Continuous     10/11/15 0040     Code Status History    Date Active Date Inactive Code Status Order ID Comments User Context   10/11/2015 12:40 AM 10/11/2015  9:08 AM Full Code [id number]  Tonye RoyaltyAlexis Hugelmeyer, DO Inpatient      TOTAL TIME TAKING CARE OF THIS PATIENT: 60 minutes.    Hower,  Mardi MainlandDavid K M.D on 10/13/2015 at 9:08 AM  Between 7am to 6pm - Pager - [phone]  After 6pm go to [email or web address] - Scientist, research (life [email or web address])password [email or web address]  [facility] [place] [facility]  Office  [phone]  CC: Primary care physician; No primary care provider on file.

## 2015-10-13 NOTE — Progress Notes (Signed)
[facility],  Dr. Selmer DominionPlichta, he has has recommended starting labetolol infusion, pain control and [facility].  Will let [facility] Clint [facility]Hower [facility].     Lucie LeatherKurian David Verne Cove, M.D.  [facility]  Medical Director [facility] Medical Director [facility] Card[facility]

## 2017-06-24 ENCOUNTER — Emergency Department
Admission: EM | Admit: 2017-06-24 | Discharge: 2017-06-24 | Disposition: A | Discharge status: Home / Self Care | Payer: Non-veteran care | Attending: Emergency Medicine | Admitting: Emergency Medicine

## 2017-06-24 ENCOUNTER — Encounter: Payer: Self-pay | Admitting: Emergency Medicine

## 2017-06-24 DIAGNOSIS — W269XXA Contact with unspecified sharp object(s), initial encounter: Secondary | ICD-10-CM | POA: Diagnosis not present

## 2017-06-24 DIAGNOSIS — S6992XA Unspecified injury of left wrist, hand and finger(s), initial encounter: Secondary | ICD-10-CM | POA: Diagnosis present

## 2017-06-24 DIAGNOSIS — Y939 Activity, unspecified: Secondary | ICD-10-CM | POA: Insufficient documentation

## 2017-06-24 DIAGNOSIS — Z79899 Other long term (current) drug therapy: Secondary | ICD-10-CM | POA: Insufficient documentation

## 2017-06-24 DIAGNOSIS — S61213A Laceration without foreign body of left middle finger without damage to nail, initial encounter: Secondary | ICD-10-CM | POA: Insufficient documentation

## 2017-06-24 DIAGNOSIS — Z7982 Long term (current) use of aspirin: Secondary | ICD-10-CM | POA: Diagnosis not present

## 2017-06-24 DIAGNOSIS — F17210 Nicotine dependence, cigarettes, uncomplicated: Secondary | ICD-10-CM | POA: Insufficient documentation

## 2017-06-24 DIAGNOSIS — I10 Essential (primary) hypertension: Secondary | ICD-10-CM | POA: Insufficient documentation

## 2017-06-24 DIAGNOSIS — Y999 Unspecified external cause status: Secondary | ICD-10-CM | POA: Insufficient documentation

## 2017-06-24 DIAGNOSIS — Y929 Unspecified place or not applicable: Secondary | ICD-10-CM | POA: Diagnosis not present

## 2017-06-24 MED ORDER — LIDOCAINE HCL (PF) 1 % IJ SOLN
10.0000 mL | Freq: Once | INTRAMUSCULAR | Status: AC
  Administered 2017-06-24: 10 mL
  Filled 2017-06-24: qty 10

## 2017-06-24 NOTE — ED Notes (Signed)
Patient was discharged prior to this nurses arrival. This nurse removed patient from the board. PA states that patient was in NAD when patient was d/c

## 2017-06-24 NOTE — ED Triage Notes (Signed)
Patient has a laceration to his left hand.  Patient reports he was tearing tinfoil and he cut himself on the box.  Patient takes blood thinners and had difficulty stopping the bleeding.  Patient is in no obvious distress at this time.  This RN re-bandaged dressing tightly to control bleeding.

## 2017-06-24 NOTE — ED Notes (Signed)
Patient to ED for laceration to left middle finger that he cut on a tinfoil box. Patient without other injury. At time of this assessment PA is already doing suture repair. Six sutures to be placed. Patient is alert and oriented and in good spirits. Denies need for anything at this time. Neuro intact, motor and sensation intact.

## 2017-06-24 NOTE — ED Provider Notes (Signed)
[facility] Emergency Department Provider Note  ____________________________________________  Time seen: Approximately 4:56 PM  I have reviewed the triage vital signs and the nursing notes.   HISTORY  Chief Complaint Extremity Laceration    HPI Cory Wilson is a 67 y.o. male who presents the emergency department complaining of laceration to the third digit of his left hand.  Patient reports that he was attempting to break down a aluminum foil box when the cutter aspect lacerated his finger.  Patient reports that he is on blood thinners and as such had a very difficult time controlling the bleeding.  Patient states that he was less concerned about the actual laceration and the bleeding aspect.  With direct pressure, patient did not have bleedthrough of current dressing.  No other injury or complaint.  No medications prior to arrival.  Patient has a history of hepatitis C but is undergone therapy and with recent lab work reveals no indication of residual hepatitis C.  Past Medical History:  Diagnosis Date  . Hepatitis C     Patient Active Problem List   Diagnosis Date Noted  . Tobacco use   . Penetrating atherosclerotic ulcer of aorta (HCC)   . Dissection of aorta, unspecified site   . Tobacco use disorder   . Essential hypertension   . Pain in the chest   . Intramural aortic hematoma (HCC) 10/10/2015    Past Surgical History:  Procedure Laterality Date  . 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 Pt states he caught something on deployment to [place]    Prior to Admission medications   Medication Sig Start Date End Date Taking? Authorizing Provider  albuterol (PROVENTIL) (2.5 MG/3ML) 0.083% nebulizer solution Take 3 mLs (2.5 mg total) by nebulization every 4 (four) hours as needed for wheezing or shortness of breath. 10/13/15   Hower, Cletis Athens, MD  antiseptic oral rinse (CPC / CETYLPYRIDINIUM CHLORIDE 0.05%) 0.05  % LIQD solution 7 mLs by Mouth Rinse route 2 (two) times daily. 10/13/15   Hower, Cletis Athens, MD  aspirin EC 81 MG tablet Take 81 mg by mouth daily.    [provider]  famotidine (PEPCID) 20-0.9 MG/50ML-% Inject 50 mLs (20 mg total) into the vein every 12 (twelve) hours. 10/13/15   Hower, Cletis Athens, MD  Fish Oil-Cholecalciferol (FISH OIL + D3 PO) Take 1 capsule by mouth daily.    [provider]  hydrALAZINE (APRESOLINE) 25 MG tablet Take 1 tablet (25 mg total) by mouth every 8 (eight) hours. 10/13/15   Hower, Cletis Athens, MD  HYDROmorphone (DILAUDID) 1 MG/ML injection Inject 1 mL (1 mg total) into the vein every 2 (two) hours as needed for severe pain. 10/13/15   Hower, Cletis Athens, MD  ipratropium-albuterol (DUONEB) 0.5-2.5 (3) MG/3ML SOLN Take 3 mLs by nebulization every 4 (four) hours. 10/13/15   Hower, Cletis Athens, MD  labetalol (NORMODYNE,TRANDATE) 5 MG/ML injection Inject 4 mLs (20 mg total) into the vein every 4 (four) hours as needed (for SBP >150). 10/13/15   Hower, Cletis Athens, MD  losartan (COZAAR) 50 MG tablet Take 1 tablet (50 mg total) by mouth daily. 10/13/15   Hower, Cletis Athens, MD  mometasone-formoterol (DULERA) 100-5 MCG/ACT AERO Inhale 2 puffs into the lungs 2 (two) times daily. 10/13/15   Hower, Cletis Athens, MD  Multiple Vitamins-Minerals (MULTIVITAMIN WITH MINERALS) tablet Take 1 tablet by mouth daily.    [provider]  niCARdipine in saline (  CARDENE-IV) 20-0.86 MG/200ML-% SOLN Inject 3-15 mg/hr into the vein continuous. 10/13/15   Hower, Cletis Athensavid K, MD  nicotine (NICODERM CQ - DOSED IN MG/24 HOURS) 21 mg/24hr patch Place 1 patch (21 mg total) onto the skin daily. 10/13/15   Hower, Cletis Athensavid K, MD  nitroPRUSSide 50 mg in dextrose 5 % 250 mL Inject 0-0.2634 mg/min into the vein continuous. 10/13/15   Hower, Cletis Athensavid K, MD  ondansetron (ZOFRAN) 4 MG/2ML SOLN injection Inject 2 mLs (4 mg total) into the vein every 6 (six) hours as needed for nausea. 10/13/15   Hower, Cletis Athensavid K, MD  pantoprazole (PROTONIX) 40 MG  tablet Take 1 tablet (40 mg total) by mouth daily at 12 noon. 10/13/15   Hower, Cletis Athensavid K, MD  senna (SENOKOT) 8.6 MG TABS tablet Take 1 tablet (8.6 mg total) by mouth daily. 10/13/15   Hower, Cletis Athensavid K, MD  sodium chloride 0.9 % infusion Inject 250 mLs into the vein as needed (if IV carrier fluid needed.). 10/13/15   Hower, Cletis Athensavid K, MD  sodium chloride 0.9 % infusion Inject 100 mLs into the vein continuous. 10/13/15   Hower, Cletis Athensavid K, MD  tiotropium (SPIRIVA) 18 MCG inhalation capsule Place 1 capsule (18 mcg total) into inhaler and inhale daily. 10/13/15   Hower, Cletis Athensavid K, MD  zolpidem (AMBIEN) 5 MG tablet Take 1 tablet (5 mg total) by mouth at bedtime as needed for sleep. 10/13/15   Hower, Cletis Athensavid K, MD    Allergies Patient has no known allergies.  No family history on file.  Social History Social History   Tobacco Use  . Smoking status: Current Every Day Smoker    Packs/day: 1.00    Types: Cigarettes  . Smokeless tobacco: Never Used  Substance Use Topics  . Alcohol use: No  . Drug use: No     Review of Systems  Constitutional: No fever/chills Eyes: No visual changes. Cardiovascular: no chest pain. Respiratory: no cough. No SOB. Gastrointestinal: No abdominal pain.  No nausea, no vomiting.  Musculoskeletal: Negative for musculoskeletal pain. Skin: Positive for laceration to the third digit of the left hand. Neurological: Negative for headaches, focal weakness or numbness. 10-point ROS otherwise negative.  ____________________________________________   PHYSICAL EXAM:  VITAL SIGNS: ED Triage Vitals [06/24/17 1536]  Enc Vitals Group     BP (!) 159/82     Pulse Rate (!) 58     Resp 18     Temp 98.9 F (37.2 C)     Temp Source Oral     SpO2 97 %     Weight 210 lb (95.3 kg)     Height 6\' 1"  (1.854 m)     Head Circumference      Peak Flow      Pain Score 4     Pain Loc      Pain Edu?      Excl. in GC?      Constitutional: Alert and oriented. Well appearing and in no acute  distress. Eyes: Conjunctivae are normal. PERRL. EOMI. Head: Atraumatic. Neck: No stridor.    Cardiovascular: Normal rate, regular rhythm. Normal S1 and S2.  Good peripheral circulation. Respiratory: Normal respiratory effort without tachypnea or retractions. Lungs CTAB. Good air entry to the bases with no decreased or absent breath sounds. Musculoskeletal: Full range of motion to all extremities. No gross deformities appreciated. Neurologic:  Normal speech and language. No gross focal neurologic deficits are appreciated.  Skin:  Skin is warm, dry and intact. No rash noted.  J laceration noted to the palmar aspect of the third digit left hand.  Bleeding is appreciated.  This is slow, dark bleeding consistent with venous blood.  Patient has good flexion and extension of the digit.  Laceration measures approximately 1 cm in length.  No visible foreign body. Psychiatric: Mood and affect are normal. Speech and behavior are normal. Patient exhibits appropriate insight and judgement.   ____________________________________________   LABS (all labs ordered are listed, but only abnormal results are displayed)  Labs Reviewed - No data to display ____________________________________________  EKG   ____________________________________________  RADIOLOGY   No results found.  ____________________________________________    PROCEDURES  Procedure(s) performed:    Marland KitchenMarland KitchenLaceration Repair Date/Time: 06/24/2017 5:37 PM Performed by: Racheal Patches, PA-C Authorized by: Racheal Patches, PA-C   Consent:    Consent obtained:  Verbal   Consent given by:  Patient   Risks discussed:  Pain Anesthesia (see MAR for exact dosages):    Anesthesia method:  Nerve block   Block location:  3rd digit left hand   Block needle gauge:  27 G   Block anesthetic:  Lidocaine 1% w/o epi   Block technique:  Digital block   Block injection procedure:  Anatomic landmarks identified, introduced needle,  negative aspiration for blood and incremental injection   Block outcome:  Anesthesia achieved Laceration details:    Location:  Finger   Finger location:  L long finger   Length (cm):  1 Repair type:    Repair type:  Simple Exploration:    Hemostasis achieved with:  Direct pressure and tied off vessels   Wound exploration: wound explored through full range of motion and entire depth of wound probed and visualized     Wound extent: vascular damage     Wound extent: no foreign bodies/material noted, no muscle damage noted, no nerve damage noted, no tendon damage noted and no underlying fracture noted     Contaminated: no   Treatment:    Area cleansed with:  Betadine   Amount of cleaning:  Extensive   Irrigation solution:  Sterile saline   Irrigation volume:  500 ml   Irrigation method:  Syringe Skin repair:    Repair method:  Sutures   Suture size:  4-0   Suture material:  Nylon   Suture technique:  Simple interrupted   Number of sutures:  6 Approximation:    Approximation:  Close Post-procedure details:    Dressing:  Sterile dressing, tube gauze and splint for protection   Patient tolerance of procedure:  Tolerated well, no immediate complications Comments:     Patient with laceration to the palmar aspect of the third digit left hand.  Edges were ragged in nature.  On exam, small venule had been lacerated.  Bleeding cessation was obtained after placing suture around bleeding venules.  Laceration is closed using simple interrupted skin sutures.  Patient tolerated well.  Splint for protection.  Wound care instructions provided to patient.  Patient tolerated well with no complications.      Medications  lidocaine (PF) (XYLOCAINE) 1 % injection 10 mL (has no administration in time range)     ____________________________________________   INITIAL IMPRESSION / ASSESSMENT AND PLAN / ED COURSE  Pertinent labs & imaging results that were available during my care of the patient  were reviewed by me and considered in my medical decision making (see chart for details).  Review of the [place] CSRS was performed in accordance of the [facility] prior to  dispensing any controlled drugs.     Patient's diagnosis is consistent with laceration to the third digit of the left hand.  This is close as described above.  Patient tolerated well with no complications.  Wound care instructions provided to patient.  He was up-to-date on tetanus.  No medications at this time.  He will follow-up with primary care in 1 week for suture removal.. Patient is given ED precautions to return to the ED for any worsening or new symptoms.     ____________________________________________  FINAL CLINICAL IMPRESSION(S) / ED DIAGNOSES  Final diagnoses:  Laceration of left middle finger without foreign body without damage to nail, initial encounter      NEW MEDICATIONS STARTED DURING THIS VISIT:  ED Discharge Orders    None          This chart was dictated using voice recognition software/Dragon. Despite best efforts to proofread, errors can occur which can change the meaning. Any change was purely unintentional.    Racheal Patches, PA-C 06/24/17 1740    Phineas Semen, MD 06/24/17 [phone]

## 2017-11-03 IMAGING — DX DG CHEST 1V PORT
1 series · 1 of 1 positions shown · non-contrast
Comparison: 10/10/2015

CLINICAL DATA: Hypoxemia

EXAM:
PORTABLE CHEST 1 VIEW

[chest ap]
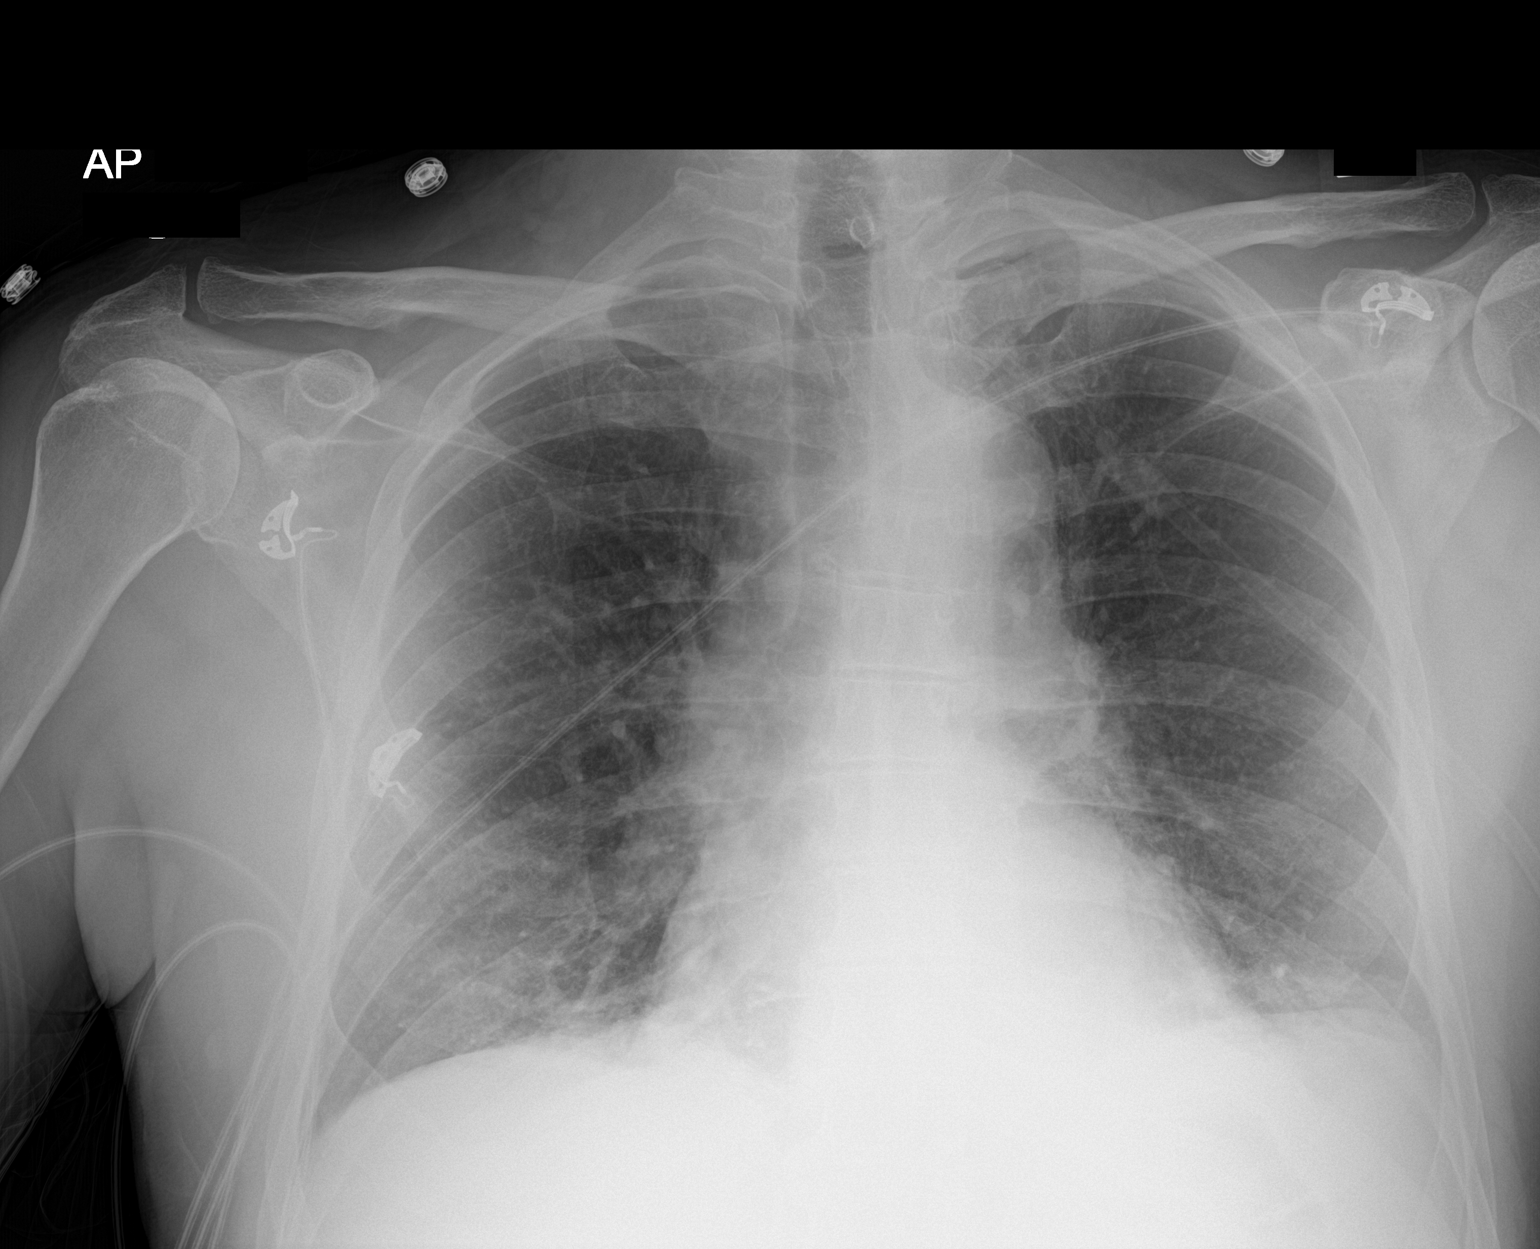

[1 of 1 positions shown; findings below may reference images not displayed]

FINDINGS: Increase in bibasilar atelectasis compared to the prior study.
Decreased lung volume. Negative for edema or effusion. Heart size
within normal limits.
IMPRESSION: Interval increase in hypoventilation with bibasilar atelectasis.

## 2021-01-04 DEATH — deceased
